# Patient Record
Sex: Female | Born: 1960 | Race: Black or African American | Hispanic: No | Marital: Married | Smoking: Never smoker
Health system: Southern US, Community
[De-identification: ages and names within clinical notes are randomized; demographics above are authoritative.]

## PROBLEM LIST (undated history)

## (undated) DIAGNOSIS — E785 Hyperlipidemia, unspecified: Secondary | ICD-10-CM

## (undated) DIAGNOSIS — I2510 Atherosclerotic heart disease of native coronary artery without angina pectoris: Secondary | ICD-10-CM

## (undated) DIAGNOSIS — I219 Acute myocardial infarction, unspecified: Secondary | ICD-10-CM

## (undated) DIAGNOSIS — I255 Ischemic cardiomyopathy: Secondary | ICD-10-CM

---

## 2006-05-13 DIAGNOSIS — I219 Acute myocardial infarction, unspecified: Secondary | ICD-10-CM

## 2006-05-13 HISTORY — DX: Acute myocardial infarction, unspecified: I21.9

## 2008-07-31 ENCOUNTER — Emergency Department: Payer: Self-pay | Admitting: Emergency Medicine

## 2012-06-09 ENCOUNTER — Emergency Department: Payer: Self-pay | Admitting: Emergency Medicine

## 2012-06-09 LAB — COMPREHENSIVE METABOLIC PANEL
Albumin: 3.8 g/dL (ref 3.4–5.0)
Alkaline Phosphatase: 56 U/L (ref 50–136)
Anion Gap: 7 (ref 7–16)
BUN: 12 mg/dL (ref 7–18)
Bilirubin,Total: 0.2 mg/dL (ref 0.2–1.0)
Calcium, Total: 8.6 mg/dL (ref 8.5–10.1)
Co2: 26 mmol/L (ref 21–32)
EGFR (Non-African Amer.): >60
Glucose: 84 mg/dL (ref 65–99)
Potassium: 4.3 mmol/L (ref 3.5–5.1)
SGOT(AST): 27 U/L (ref 15–37)
SGPT (ALT): 28 U/L (ref 12–78)
Sodium: 137 mmol/L (ref 136–145)
Total Protein: 7.2 g/dL (ref 6.4–8.2)

## 2012-06-09 LAB — URINALYSIS, COMPLETE
Bilirubin,UR: NEGATIVE
Blood: NEGATIVE
Ketone: NEGATIVE
Leukocyte Esterase: NEGATIVE
Ph: 7 (ref 4.5–8.0)
Specific Gravity: 1.003 (ref 1.003–1.030)
Squamous Epithelial: 2
WBC UR: <1 /HPF (ref 0–5)

## 2012-06-09 LAB — CBC
HGB: 12.4 g/dL (ref 12.0–16.0)
MCH: 27.1 pg (ref 26.0–34.0)
MCV: 85 fL (ref 80–100)
RBC: 4.56 10*6/uL (ref 3.80–5.20)
RDW: 13.1 % (ref 11.5–14.5)
WBC: 4.2 10*3/uL (ref 3.6–11.0)
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
## 2016-01-11 ENCOUNTER — Inpatient Hospital Stay (HOSPITAL_COMMUNITY)
Admission: EM | Disposition: A | Discharge status: Home / Self Care | Payer: Self-pay | Source: Home / Self Care | Attending: Internal Medicine

## 2016-01-11 ENCOUNTER — Inpatient Hospital Stay (HOSPITAL_COMMUNITY)
Admission: EM | Admit: 2016-01-11 | Discharge: 2016-01-13 | DRG: 246 | Disposition: A | Discharge status: Home / Self Care | Payer: 59 | Attending: Internal Medicine | Admitting: Internal Medicine

## 2016-01-11 ENCOUNTER — Encounter (HOSPITAL_COMMUNITY): Payer: Self-pay | Admitting: *Deleted

## 2016-01-11 ENCOUNTER — Emergency Department (HOSPITAL_COMMUNITY): Payer: 59

## 2016-01-11 DIAGNOSIS — Z7982 Long term (current) use of aspirin: Secondary | ICD-10-CM

## 2016-01-11 DIAGNOSIS — I25700 Atherosclerosis of coronary artery bypass graft(s), unspecified, with unstable angina pectoris: Secondary | ICD-10-CM | POA: Diagnosis not present

## 2016-01-11 DIAGNOSIS — R079 Chest pain, unspecified: Secondary | ICD-10-CM | POA: Diagnosis present

## 2016-01-11 DIAGNOSIS — I2582 Chronic total occlusion of coronary artery: Secondary | ICD-10-CM | POA: Diagnosis present

## 2016-01-11 DIAGNOSIS — E785 Hyperlipidemia, unspecified: Secondary | ICD-10-CM | POA: Diagnosis present

## 2016-01-11 DIAGNOSIS — I2129 ST elevation (STEMI) myocardial infarction involving other sites: Secondary | ICD-10-CM | POA: Diagnosis present

## 2016-01-11 DIAGNOSIS — I252 Old myocardial infarction: Secondary | ICD-10-CM

## 2016-01-11 DIAGNOSIS — I255 Ischemic cardiomyopathy: Secondary | ICD-10-CM | POA: Diagnosis present

## 2016-01-11 DIAGNOSIS — Z79899 Other long term (current) drug therapy: Secondary | ICD-10-CM | POA: Diagnosis not present

## 2016-01-11 DIAGNOSIS — I2121 ST elevation (STEMI) myocardial infarction involving left circumflex coronary artery: Secondary | ICD-10-CM

## 2016-01-11 DIAGNOSIS — I2510 Atherosclerotic heart disease of native coronary artery without angina pectoris: Secondary | ICD-10-CM | POA: Diagnosis present

## 2016-01-11 DIAGNOSIS — Z955 Presence of coronary angioplasty implant and graft: Secondary | ICD-10-CM | POA: Diagnosis not present

## 2016-01-11 DIAGNOSIS — Z8249 Family history of ischemic heart disease and other diseases of the circulatory system: Secondary | ICD-10-CM | POA: Diagnosis not present

## 2016-01-11 DIAGNOSIS — I2109 ST elevation (STEMI) myocardial infarction involving other coronary artery of anterior wall: Secondary | ICD-10-CM | POA: Diagnosis not present

## 2016-01-11 DIAGNOSIS — I213 ST elevation (STEMI) myocardial infarction of unspecified site: Secondary | ICD-10-CM

## 2016-01-11 DIAGNOSIS — I2542 Coronary artery dissection: Secondary | ICD-10-CM | POA: Diagnosis present

## 2016-01-11 HISTORY — PX: CARDIAC CATHETERIZATION: SHX172

## 2016-01-11 HISTORY — DX: Atherosclerotic heart disease of native coronary artery without angina pectoris: I25.10

## 2016-01-11 HISTORY — DX: Acute myocardial infarction, unspecified: I21.9

## 2016-01-11 HISTORY — DX: Ischemic cardiomyopathy: I25.5

## 2016-01-11 HISTORY — DX: Hyperlipidemia, unspecified: E78.5

## 2016-01-11 LAB — CBC WITH DIFFERENTIAL/PLATELET
Basophils Absolute: 0 10*3/uL (ref 0.0–0.1)
Basophils Relative: 1 %
Eosinophils Absolute: 0.1 10*3/uL (ref 0.0–0.7)
Eosinophils Relative: 1 %
HCT: 38.2 % (ref 36.0–46.0)
HEMOGLOBIN: 12.2 g/dL (ref 12.0–15.0)
LYMPHS ABS: 1.3 10*3/uL (ref 0.7–4.0)
LYMPHS PCT: 33 %
MCH: 27 pg (ref 26.0–34.0)
MCHC: 31.9 g/dL (ref 30.0–36.0)
MCV: 84.5 fL (ref 78.0–100.0)
Monocytes Absolute: 0.4 10*3/uL (ref 0.1–1.0)
Monocytes Relative: 9 %
NEUTROS ABS: 2.3 10*3/uL (ref 1.7–7.7)
NEUTROS PCT: 56 %
Platelets: 226 10*3/uL (ref 150–400)
RBC: 4.52 MIL/uL (ref 3.87–5.11)
RDW: 13.2 % (ref 11.5–15.5)
WBC: 4.1 10*3/uL (ref 4.0–10.5)

## 2016-01-11 LAB — COMPREHENSIVE METABOLIC PANEL
ALT: 19 U/L (ref 14–54)
AST: 26 U/L (ref 15–41)
Albumin: 3.6 g/dL (ref 3.5–5.0)
Alkaline Phosphatase: 47 U/L (ref 38–126)
Anion gap: 9 (ref 5–15)
BUN: 12 mg/dL (ref 6–20)
CHLORIDE: 102 mmol/L (ref 101–111)
CO2: 24 mmol/L (ref 22–32)
CREATININE: 0.83 mg/dL (ref 0.44–1.00)
Calcium: 9.2 mg/dL (ref 8.9–10.3)
GFR calc non Af Amer: >60 mL/min (ref >60)
Glucose, Bld: 150 mg/dL — ABNORMAL HIGH (ref 65–99)
POTASSIUM: 3.2 mmol/L — AB (ref 3.5–5.1)
SODIUM: 135 mmol/L (ref 135–145)
Total Bilirubin: 0.5 mg/dL (ref 0.3–1.2)
Total Protein: 6.1 g/dL — ABNORMAL LOW (ref 6.5–8.1)

## 2016-01-11 LAB — TROPONIN I
TROPONIN I: 25.83 ng/mL — AB (ref <0.03)
Troponin I: 11.33 ng/mL (ref <0.03)

## 2016-01-11 LAB — POCT I-STAT, CHEM 8
BUN: 15 mg/dL (ref 6–20)
CHLORIDE: 101 mmol/L (ref 101–111)
CREATININE: 0.6 mg/dL (ref 0.44–1.00)
Calcium, Ion: 1.12 mmol/L — ABNORMAL LOW (ref 1.15–1.40)
GLUCOSE: 153 mg/dL — AB (ref 65–99)
HCT: 36 % (ref 36.0–46.0)
Hemoglobin: 12.2 g/dL (ref 12.0–15.0)
POTASSIUM: 3.2 mmol/L — AB (ref 3.5–5.1)
Sodium: 140 mmol/L (ref 135–145)
TCO2: 25 mmol/L (ref 0–100)

## 2016-01-11 LAB — CBC
HEMATOCRIT: 41.9 % (ref 36.0–46.0)
HEMOGLOBIN: 13.4 g/dL (ref 12.0–15.0)
MCH: 27.4 pg (ref 26.0–34.0)
MCHC: 32 g/dL (ref 30.0–36.0)
MCV: 85.7 fL (ref 78.0–100.0)
Platelets: 236 10*3/uL (ref 150–400)
RBC: 4.89 MIL/uL (ref 3.87–5.11)
RDW: 13.2 % (ref 11.5–15.5)
WBC: 11.3 10*3/uL — AB (ref 4.0–10.5)

## 2016-01-11 LAB — POCT ACTIVATED CLOTTING TIME
ACTIVATED CLOTTING TIME: 191 s
ACTIVATED CLOTTING TIME: 246 s
ACTIVATED CLOTTING TIME: 301 s
Activated Clotting Time: 379 seconds

## 2016-01-11 LAB — BASIC METABOLIC PANEL
Anion gap: 8 (ref 5–15)
BUN: 10 mg/dL (ref 6–20)
CHLORIDE: 106 mmol/L (ref 101–111)
CO2: 23 mmol/L (ref 22–32)
Calcium: 9.2 mg/dL (ref 8.9–10.3)
Creatinine, Ser: 0.74 mg/dL (ref 0.44–1.00)
GFR calc Af Amer: >60 mL/min (ref >60)
GFR calc non Af Amer: >60 mL/min (ref >60)
GLUCOSE: 92 mg/dL (ref 65–99)
POTASSIUM: 4.3 mmol/L (ref 3.5–5.1)
Sodium: 137 mmol/L (ref 135–145)

## 2016-01-11 LAB — CREATININE, SERUM: Creatinine, Ser: 0.85 mg/dL (ref 0.44–1.00)

## 2016-01-11 LAB — PROTIME-INR
INR: 1.1
Prothrombin Time: 14.2 seconds (ref 11.4–15.2)

## 2016-01-11 LAB — I-STAT TROPONIN, ED: Troponin i, poc: 0.01 ng/mL (ref 0.00–0.08)

## 2016-01-11 LAB — MRSA PCR SCREENING: MRSA by PCR: NEGATIVE

## 2016-01-11 LAB — PLATELET COUNT: Platelets: 267 10*3/uL (ref 150–400)

## 2016-01-11 SURGERY — LEFT HEART CATH AND CORONARY ANGIOGRAPHY
Anesthesia: LOCAL

## 2016-01-11 MED ORDER — SODIUM CHLORIDE 0.9 % IV SOLN
INTRAVENOUS | Status: DC | PRN
  Administered 2016-01-11: 250 mL via INTRAVENOUS

## 2016-01-11 MED ORDER — METOPROLOL TARTRATE 12.5 MG HALF TABLET
12.5000 mg | ORAL_TABLET | Freq: Two times a day (BID) | ORAL | Status: DC
  Administered 2016-01-11 – 2016-01-13 (×4): 12.5 mg via ORAL
  Filled 2016-01-11 (×4): qty 1

## 2016-01-11 MED ORDER — MORPHINE SULFATE (PF) 2 MG/ML IV SOLN
2.0000 mg | INTRAVENOUS | Status: DC | PRN
  Administered 2016-01-11 – 2016-01-12 (×3): 2 mg via INTRAVENOUS
  Filled 2016-01-11 (×3): qty 1

## 2016-01-11 MED ORDER — NITROGLYCERIN 1 MG/10 ML FOR IR/CATH LAB
INTRA_ARTERIAL | Status: AC
  Filled 2016-01-11: qty 10

## 2016-01-11 MED ORDER — IOPAMIDOL (ISOVUE-370) INJECTION 76%
INTRAVENOUS | Status: AC
  Filled 2016-01-11: qty 125

## 2016-01-11 MED ORDER — NITROGLYCERIN 0.4 MG SL SUBL
0.4000 mg | SUBLINGUAL_TABLET | SUBLINGUAL | Status: DC | PRN
  Administered 2016-01-11 (×2): 0.4 mg via SUBLINGUAL
  Filled 2016-01-11: qty 1
  Filled 2016-01-11: qty 25
  Filled 2016-01-11 (×2): qty 1

## 2016-01-11 MED ORDER — ONDANSETRON HCL 4 MG/2ML IJ SOLN
4.0000 mg | Freq: Once | INTRAMUSCULAR | Status: AC
  Administered 2016-01-11: 4 mg via INTRAVENOUS
  Filled 2016-01-11: qty 2

## 2016-01-11 MED ORDER — HEPARIN BOLUS VIA INFUSION
4000.0000 [IU] | Freq: Once | INTRAVENOUS | Status: AC
  Administered 2016-01-11: 4000 [IU] via INTRAVENOUS
  Filled 2016-01-11: qty 4000

## 2016-01-11 MED ORDER — ASPIRIN 81 MG PO CHEW
81.0000 mg | CHEWABLE_TABLET | Freq: Every day | ORAL | Status: DC
  Administered 2016-01-12 – 2016-01-13 (×2): 81 mg via ORAL
  Filled 2016-01-11 (×2): qty 1

## 2016-01-11 MED ORDER — HEPARIN SODIUM (PORCINE) 5000 UNIT/ML IJ SOLN
INTRAMUSCULAR | Status: AC
  Filled 2016-01-11: qty 1

## 2016-01-11 MED ORDER — AMIODARONE LOAD VIA INFUSION
INTRAVENOUS | Status: DC | PRN
  Administered 2016-01-11: 300 mg via INTRAVENOUS

## 2016-01-11 MED ORDER — VERAPAMIL HCL 2.5 MG/ML IV SOLN
INTRAVENOUS | Status: AC
  Filled 2016-01-11: qty 2

## 2016-01-11 MED ORDER — ASPIRIN 81 MG PO CHEW: 81.0000 mg | CHEWABLE_TABLET | Freq: Every day | ORAL | Status: DC

## 2016-01-11 MED ORDER — HEART ATTACK BOUNCING BOOK
Freq: Once | Status: AC
  Administered 2016-01-11: 13:00:00
  Filled 2016-01-11: qty 1

## 2016-01-11 MED ORDER — TIROFIBAN HCL IN NACL 5-0.9 MG/100ML-% IV SOLN
INTRAVENOUS | Status: DC | PRN
  Administered 2016-01-11: 0.15 ug/kg/min via INTRAVENOUS

## 2016-01-11 MED ORDER — SODIUM CHLORIDE 0.9% FLUSH: 3.0000 mL | INTRAVENOUS | Status: DC | PRN

## 2016-01-11 MED ORDER — VERAPAMIL HCL 2.5 MG/ML IV SOLN
INTRAVENOUS | Status: DC | PRN
  Administered 2016-01-11: 10:00:00 via INTRA_ARTERIAL

## 2016-01-11 MED ORDER — ACETAMINOPHEN 325 MG PO TABS
650.0000 mg | ORAL_TABLET | ORAL | Status: DC | PRN
  Administered 2016-01-11: 650 mg via ORAL
  Filled 2016-01-11: qty 2

## 2016-01-11 MED ORDER — HEPARIN SODIUM (PORCINE) 1000 UNIT/ML IJ SOLN
INTRAMUSCULAR | Status: AC
  Filled 2016-01-11: qty 1

## 2016-01-11 MED ORDER — TIROFIBAN (AGGRASTAT) BOLUS VIA INFUSION
INTRAVENOUS | Status: DC | PRN
  Administered 2016-01-11: 1655 ug via INTRAVENOUS

## 2016-01-11 MED ORDER — SODIUM CHLORIDE 0.9 % IV SOLN
250.0000 mL | INTRAVENOUS | Status: DC | PRN
  Administered 2016-01-11: 250 mL via INTRAVENOUS

## 2016-01-11 MED ORDER — SODIUM CHLORIDE 0.9 % IV SOLN
INTRAVENOUS | Status: AC
  Administered 2016-01-11: 12:00:00 via INTRAVENOUS

## 2016-01-11 MED ORDER — TIROFIBAN HCL IN NACL 5-0.9 MG/100ML-% IV SOLN
INTRAVENOUS | Status: AC
  Filled 2016-01-11: qty 100

## 2016-01-11 MED ORDER — ZOLPIDEM TARTRATE 5 MG PO TABS
5.0000 mg | ORAL_TABLET | Freq: Every evening | ORAL | Status: DC | PRN
  Administered 2016-01-11 – 2016-01-12 (×2): 5 mg via ORAL
  Filled 2016-01-11 (×2): qty 1

## 2016-01-11 MED ORDER — TICAGRELOR 90 MG PO TABS
ORAL_TABLET | ORAL | Status: DC | PRN
  Administered 2016-01-11: 180 mg via ORAL

## 2016-01-11 MED ORDER — ONDANSETRON HCL 4 MG/2ML IJ SOLN: 4.0000 mg | Freq: Four times a day (QID) | INTRAMUSCULAR | Status: DC | PRN

## 2016-01-11 MED ORDER — NITROGLYCERIN IN D5W 200-5 MCG/ML-% IV SOLN
0.0000 ug/min | INTRAVENOUS | Status: DC
  Administered 2016-01-11: 5 ug/min via INTRAVENOUS
  Filled 2016-01-11: qty 250

## 2016-01-11 MED ORDER — ENOXAPARIN SODIUM 40 MG/0.4ML ~~LOC~~ SOLN
40.0000 mg | SUBCUTANEOUS | Status: DC
  Administered 2016-01-12 – 2016-01-13 (×2): 40 mg via SUBCUTANEOUS
  Filled 2016-01-11 (×2): qty 0.4

## 2016-01-11 MED ORDER — ATORVASTATIN CALCIUM 80 MG PO TABS
80.0000 mg | ORAL_TABLET | Freq: Every day | ORAL | Status: DC
  Administered 2016-01-11 – 2016-01-12 (×2): 80 mg via ORAL
  Filled 2016-01-11 (×2): qty 1

## 2016-01-11 MED ORDER — IOPAMIDOL (ISOVUE-370) INJECTION 76%
INTRAVENOUS | Status: DC | PRN
  Administered 2016-01-11: 255 mL via INTRA_ARTERIAL

## 2016-01-11 MED ORDER — TICAGRELOR 90 MG PO TABS
ORAL_TABLET | ORAL | Status: AC
  Filled 2016-01-11: qty 2

## 2016-01-11 MED ORDER — IOPAMIDOL (ISOVUE-370) INJECTION 76%
INTRAVENOUS | Status: AC
  Filled 2016-01-11: qty 100

## 2016-01-11 MED ORDER — FENTANYL CITRATE (PF) 100 MCG/2ML IJ SOLN
100.0000 ug | Freq: Once | INTRAMUSCULAR | Status: AC
Start: 2016-01-11 — End: 2016-01-11
  Administered 2016-01-11: 100 ug via INTRAVENOUS
  Filled 2016-01-11: qty 2

## 2016-01-11 MED ORDER — HEPARIN SODIUM (PORCINE) 5000 UNIT/ML IJ SOLN: 60.0000 [IU]/kg | Freq: Once | INTRAMUSCULAR | Status: DC

## 2016-01-11 MED ORDER — TICAGRELOR 90 MG PO TABS
90.0000 mg | ORAL_TABLET | Freq: Two times a day (BID) | ORAL | Status: DC
  Administered 2016-01-11 – 2016-01-13 (×4): 90 mg via ORAL
  Filled 2016-01-11 (×4): qty 1

## 2016-01-11 MED ORDER — FENTANYL CITRATE (PF) 100 MCG/2ML IJ SOLN
INTRAMUSCULAR | Status: DC | PRN
  Administered 2016-01-11: 25 ug via INTRAVENOUS

## 2016-01-11 MED ORDER — SODIUM CHLORIDE 0.9% FLUSH
3.0000 mL | Freq: Two times a day (BID) | INTRAVENOUS | Status: DC
  Administered 2016-01-11 – 2016-01-13 (×4): 3 mL via INTRAVENOUS

## 2016-01-11 MED ORDER — LIDOCAINE HCL (PF) 1 % IJ SOLN
INTRAMUSCULAR | Status: DC | PRN
  Administered 2016-01-11: 12:00:00

## 2016-01-11 MED ORDER — HEPARIN (PORCINE) IN NACL 2-0.9 UNIT/ML-% IJ SOLN
INTRAMUSCULAR | Status: AC
  Filled 2016-01-11: qty 1000

## 2016-01-11 MED ORDER — FENTANYL CITRATE (PF) 100 MCG/2ML IJ SOLN
INTRAMUSCULAR | Status: AC
  Filled 2016-01-11: qty 2

## 2016-01-11 MED ORDER — LIDOCAINE HCL (PF) 1 % IJ SOLN
INTRAMUSCULAR | Status: AC
  Filled 2016-01-11: qty 30

## 2016-01-11 MED ORDER — NITROGLYCERIN 0.4 MG SL SUBL
SUBLINGUAL_TABLET | SUBLINGUAL | Status: AC
  Filled 2016-01-11: qty 1

## 2016-01-11 MED ORDER — HEPARIN SODIUM (PORCINE) 1000 UNIT/ML IJ SOLN
INTRAMUSCULAR | Status: DC | PRN
  Administered 2016-01-11: 3000 [IU] via INTRAVENOUS
  Administered 2016-01-11 (×2): 2000 [IU] via INTRAVENOUS

## 2016-01-11 MED ORDER — ATROPINE SULFATE 1 MG/10ML IJ SOSY
PREFILLED_SYRINGE | INTRAMUSCULAR | Status: AC
  Filled 2016-01-11: qty 10

## 2016-01-11 MED ORDER — AMIODARONE HCL 150 MG/3ML IV SOLN
INTRAVENOUS | Status: AC
  Filled 2016-01-11: qty 6

## 2016-01-11 MED ORDER — POTASSIUM CHLORIDE ER 10 MEQ PO TBCR
40.0000 meq | EXTENDED_RELEASE_TABLET | Freq: Once | ORAL | Status: AC
  Administered 2016-01-11: 40 meq via ORAL
  Filled 2016-01-11 (×2): qty 4

## 2016-01-11 MED ORDER — IOPAMIDOL (ISOVUE-370) INJECTION 76%
INTRAVENOUS | Status: AC
  Filled 2016-01-11: qty 50

## 2016-01-11 SURGICAL SUPPLY — 23 items
BALLN EMERGE MR 2.0X12 (BALLOONS) ×2
BALLN EMERGE MR 2.5X15 (BALLOONS) ×2
BALLN ~~LOC~~ EMERGE MR 2.25X12 (BALLOONS) ×2
BALLN ~~LOC~~ TREK RX 3.0X15 (BALLOONS) ×2
BALLOON EMERGE MR 2.0X12 (BALLOONS) ×1 IMPLANT
BALLOON EMERGE MR 2.5X15 (BALLOONS) ×1 IMPLANT
BALLOON ~~LOC~~ EMERGE MR 2.25X12 (BALLOONS) ×1 IMPLANT
BALLOON ~~LOC~~ TREK RX 3.0X15 (BALLOONS) ×1 IMPLANT
CATH IMPULSE 5F ANG/FL3.5 (CATHETERS) ×2 IMPLANT
CATH VISTA GUIDE 6FR XB3 (CATHETERS) ×2 IMPLANT
DEVICE RAD COMP TR BAND LRG (VASCULAR PRODUCTS) ×2 IMPLANT
GLIDESHEATH SLEND SS 6F .021 (SHEATH) ×2 IMPLANT
KIT ENCORE 26 ADVANTAGE (KITS) ×4 IMPLANT
KIT HEART LEFT (KITS) ×2 IMPLANT
PACK CARDIAC CATHETERIZATION (CUSTOM PROCEDURE TRAY) ×2 IMPLANT
STENT PROMUS PREM MR 2.25X12 (Permanent Stent) ×2 IMPLANT
STENT PROMUS PREM MR 2.25X20 (Permanent Stent) ×2 IMPLANT
STENT PROMUS PREM MR 3.0X32 (Permanent Stent) ×2 IMPLANT
TRANSDUCER W/STOPCOCK (MISCELLANEOUS) ×2 IMPLANT
TUBING CIL FLEX 10 FLL-RA (TUBING) ×2 IMPLANT
WIRE ASAHI PROWATER 180CM (WIRE) ×2 IMPLANT
WIRE RUNTHROUGH .014X180CM (WIRE) ×2 IMPLANT
WIRE SAFE-T 1.5MM-J .035X260CM (WIRE) ×2 IMPLANT

## 2016-01-11 NOTE — ED Notes (Signed)
Patient undressed, in gown, on monitor, continuous pulse, blood pressure cuff and oxygen [place] (2L); Myself, Shanda BumpsJessica, RN, Alphonzo LemmingsWhitney, RN, Nehemiah SettleBrooke, RN and [place], RN all present in room with patient

## 2016-01-11 NOTE — H&P (Signed)
History & Physical    Patient ID: Tamara [name] MRN: [id number], DOB/AGE: 53-Apr-1962   Admit date: 01/11/2016   Primary Physician: No primary care provider on file. Primary Cardiologist: New  Patient Profile    55 yo female with PMH of HLD and CAD s/p stent to LAD in 2008 who [facility] with central chest pain and back pain while at work.   Past Medical History    Past Medical History:  Diagnosis Date  . Hyperlipemia   . Myocardial infarct (HCC) 05/13/2006   a. PCI with DES to LAD (2008)    No past surgical history on file.   Allergies  Allergies  Allergen Reactions  . Penicillins Rash    History of Present Illness    Mrs. Tamara [name] is a 55 yo female with PMH of HLD and CAD s/p stenting of LAD back in [facility]. In review of her records she underwent myocardial perfusion testing in 3/16 showing no evidence of ischemia or scarring.  Home medications included ASA 81mg , and high dose statin. Reported an intolerance to metoprolol previously, but reported willing to resume if indicated. Her EF at that time was noted at >65%.    Reports she was sitting at work today at her desk around 8am when she developed a sudden onset of centralized chest pain with radiation into her back. These symptoms are similar to those she experienced with her last stent. She was [facility] via EMS and given 324 ASA with 3 SL nitro with no relief. EKG on arrival to the ED showed ST depression in v2,v3,v4 with reciprocal changes in the v6.   In the ED she continued to report ongoing chest pain and was given 100mcg of fentanyl, along with a 4000 unit bolus of heparin. She was pale and diaphoretic. A code STEMI was activated in the ED and she was [facility] emerg[facility] with [facility].   Home Medications    Prior to Admission medications   Not on File    Family History    Father with history of CAD and polycystic kidney  disease. Brother with polycystic kidney disease. Mother with diabetes. Sister with aneurysm.  Social History    Social History   Social History  . Marital status: Married    Spouse name: N/A  . Number of children: N/A  . Years of education: N/A   Occupational History  . Not on file.   Social History Main Topics  . Smoking status: Never Smoker  . Smokeless tobacco: Never Used  . Alcohol use Not on file  . Drug use: Unknown  . Sexual activity: Not on file   Other Topics Concern  . Not on file   Social History Narrative  . No narrative on file     Review of Systems    General:  No chills, fever, night sweats or weight changes.  Cardiovascular:  See HPI Dermatological: No rash, lesions/masses Respiratory: No cough, dyspnea Urologic: No hematuria, dysuria Abdominal:   No nausea, vomiting, diarrhea, bright red blood per rectum, melena, or hematemesis Neurologic:  No visual changes, wkns, changes in mental status. All other systems reviewed and are otherwise negative except as noted above.  Physical Exam    Blood pressure 137/82, pulse 65, temperature (!) 96.7 F (35.9 C), temperature source Axillary, resp. rate 21, height 5\' 4"  (1.626 m), weight 146 lb (66.2 kg), SpO2 99 %.  General:  Pale, grimacing, diaphoretic  Psych: Normal affect. Neuro: Alert and oriented X 3. Moves all extremities spontaneously. HEENT: Normal  Neck: Supple without bruits or JVD. Lungs:  Resp regular and unlabored, CTA. Heart: RRR no s3, s4, or murmurs. Abdomen: Soft, non-tender, BS + x 4.  Extremities: No clubbing, cyanosis or edema. DP/PT/Radials 2+ and equal bilaterally.  Labs    Troponin [facility] Test)  Recent Labs  01/11/16 0915  TROPIPOC 0.01   No results for input(s): CKTOTAL, CKMB, TROPONINI in the last 72 hours. Lab Results  Component Value Date   WBC 4.1 01/11/2016   HGB 12.2 01/11/2016   HCT 38.2 01/11/2016   MCV 84.5 01/11/2016   PLT 226 01/11/2016     Recent  Labs Lab 01/11/16 0909  NA 135  K 3.2*  CL 102  CO2 24  BUN 12  CREATININE 0.83  CALCIUM 9.2  PROT 6.1*  BILITOT 0.5  ALKPHOS 47  ALT 19  AST 26  GLUCOSE 150*   No results found for: CHOL, HDL, LDLCALC, TRIG No results found for: [facility]   Radiology Studies    Dg Chest Port 1 View  Result Date: 01/11/2016 CLINICAL DATA:  Chest pain for 1 day EXAM: PORTABLE CHEST 1 VIEW COMPARISON:  None. FINDINGS: Lungs are clear. Heart size and pulmonary vascularity are normal. No adenopathy. No pneumothorax. No bone lesions. IMPRESSION: No edema or consolidation. Electronically Signed   By: Bretta Bang III M.D.   On: 01/11/2016 09:25    ECG & Cardiac Imaging    PET perfusion study: 07/22/2014  Impressions: - Normal myocardial perfusion study. - No evidence for significant ischemia or scar is noted. - During stress: Global systolic function is normal. The ejection fraction  was greater than 65%.  Assessment & Plan    1. STEMI: She was taken to cath emergently for [facility] and possible PCI with Dr. Okey Dupre. Further recommendations following procedure.  Janice Coffin, NP-C Pager [phone] 01/11/2016, 11:06 AM Patient seen and examined and history reviewed. Agree with above findings and plan. 55 yo BF with history of CAD s/p remote stenting of the LAD in 2008. Last PET perfusion scan in March 2016 was normal. She has treated hypercholesterolemia. Compliant with meds. Today developed severe crushing SSCP while at work. Ecg shows ST depression in leads V1-3 with ST elevation in lead V6 consistent with acute posterior STEMI.  CODE STEMI activated. Patient given ASA and IV heparin. Will proceed with emergent cardiac cath/PCI.   Kiaira Pointer [place], MDFACC 01/11/2016 11:06 AM

## 2016-01-11 NOTE — Progress Notes (Signed)
Cardiology Event Note  Subjective:  I was alerted by PA Janee Mornhompson that the patient has continued to have mild to moderate chest pain following PCI to LCx/OM this morning.  The pt currently rates her pain as 5/10 in intensity and feels similar to but much less severe than what brought her to the ED this morning.  She denies shortness of breath and nausea.  She received SL NTG and IV morphine with some improvement.  Objective:   Temp:  [96.7 F (35.9 C)-97.5 F (36.4 C)] 97.5 F (36.4 C) (08/31 1518) Pulse Rate:  [0-155] 56 (08/31 1600) Resp:  [0-25] 10 (08/31 1600) BP: (100-149)/(58-87) 123/81 (08/31 1600) SpO2:  [0 %-100 %] 100 % (08/31 1600) Weight:  [66.2 kg (146 lb)-66.6 kg (146 lb 13.2 oz)] 66.6 kg (146 lb 13.2 oz) (08/31 1200) Gen: Well-developed woman lying comfortably in bed. Lungs: Clear anteriorly, normal work of breathing. CV: RRR without m/r/g Abd: Soft, NT/ND. Ext: Right radial arteriotomy site clean with TR band in place.  EKG: NSR and low voltage.  Pre-PCI anterior ST depressions and ST elevation in V6 have resolved.  Limited bedside echo: Inferolateral hypokinesis with otherwise preserved LV function.  LVEF mildly reduced.  No pericardial effusion.  Assessment/Plan: 55 y/o woman s/p posterior STEMI with occlusion of large OM1 branch and SCAD versus ulcerated plaque involving the proximal LCx.  He chest pain has improved significantly since PCI, and now has improved further with NTG and morphine.  Her EKG is reassuring.  Echo shows hypokinesis in the LCx/OM territory but no evidence of pericardial effusion.  I suspect continued chest pain may be due to microvascular embolization during procedure.  We will titrate NTG infusion to relief of pain, while maintaining SBP > 100 mmHg.  Further uptitration of metoprolol is limited by her blood pressure.  Will continue to monitor in 2H.  Formal echocardiogram is pending.  Yvonne Kendallhristopher Chanoch Mccleery, MD [facility] Pager: [phone]

## 2016-01-11 NOTE — ED Triage Notes (Signed)
[facility] with new onset substernal chest sharp, pressure type pain rating a 10 out of a 10 pain. Pain radiates to upper back and left arm. Patient was sitting at a desk when the pain started. Hx of MI and stent placement 10 years ago. C/o of dizziness with pain. Patient took 325 mg Asprin and nitro x3 with no relief. IV access 20 LAC. EMS EKG showed ST depression in the V3 and V4. V/S stable.

## 2016-01-11 NOTE — Progress Notes (Signed)
    Called for ongoing chest pain since cath. No worsening and not as bad as when she presented. ECG with resolution of ST depression in V1-V3 and STE in V6. Discussed with Dr. Okey DupreEnd who will go see. Will give her some SL NTG and morphine as needed.   Cline CrockKathryn Yentl Verge PA-C  [facility]

## 2016-01-11 NOTE — Care Management Note (Signed)
Case Management Note  Patient Details  Name: Tamara [name] MRN: [id number] Date of Birth: [birth year]  Subjective/Objective:     Adm w stemi               Action/Plan: lives w fam   Expected Discharge Date:                  Expected Discharge Plan:  Home/Self Care  In-House Referral:     Discharge planning Services  CM Consult, Medication Assistance  Post Acute Care Choice:    Choice offered to:     DME Arranged:    DME Agency:     HH Arranged:    HH Agency:     Status of Service:     If discussed at [employer] of [employer] Meetings, dates discussed:    Additional Comments: gave pt 30day free and copay card for brilinta. Pt states has ins.   Hanley Haysowell, Jarold Macomber T, RN 01/11/2016, 2:03 PM

## 2016-01-11 NOTE — ED Provider Notes (Signed)
[facility]-EMERGENCY DEPT Provider Note   CSN: [id number] Arrival date & time: 01/11/16  4098     History   Chief Complaint Chief Complaint  Patient presents with  . Chest Pain    HPI Tamara [name] is a 55 y.o. female.  Tamara [name] is a 55 y.o. Female who presents to the emergency department via [facility] complaining of sudden onset of substernal sharp chest pain that radiates to the back of her shoulder blades. She reports her pain is a 10 out of 10 and can radiate to her left arm. She reports some numbness to her left arm.  Patient reports history of MI previously and has had stenting done in 2008 in [place]. She is followed by cardiologist in [facility]. Patient had their 25 mg of aspirin and 3 rounds of nitroglycerin by EMS. No relief with nitroglycerin. Patient denies fevers, coughing, shortness of breath, abdominal pain, nausea, vomiting or rashes.   The history is provided by the patient and medical records. No language interpreter was used.  Chest Pain   Associated symptoms include numbness. Pertinent negatives include no abdominal pain, no back pain, no cough, no fever, no headaches, no nausea, no palpitations, no shortness of breath, no vomiting and no weakness.    Past Medical History:  Diagnosis Date  . Hyperlipemia   . Myocardial infarct (HCC) 05/13/2006    There are no active problems to display for this patient.   No past surgical history on file.  OB History    No data available       Home Medications    Prior to Admission medications   Not on File    Family History No family history on file.  Social History Social History  Substance Use Topics  . Smoking status: Never Smoker  . Smokeless tobacco: Never Used  . Alcohol use Not on file     Allergies   Penicillins   Review of Systems Review of Systems  Constitutional: Negative for chills and fever.  HENT: Negative for congestion and sore throat.   Eyes: Negative for  visual disturbance.  Respiratory: Negative for cough and shortness of breath.   Cardiovascular: Positive for chest pain. Negative for palpitations and leg swelling.  Gastrointestinal: Negative for abdominal pain, diarrhea, nausea and vomiting.  Genitourinary: Negative for dysuria.  Musculoskeletal: Negative for back pain and neck pain.  Skin: Negative for rash.  Neurological: Positive for numbness. Negative for weakness and headaches.     Physical Exam Updated Vital Signs BP 137/82   Pulse 65   Temp (!) 96.7 F (35.9 C) (Axillary)   Resp 21   Ht 5\' 4"  (1.626 m)   Wt 66.2 kg   SpO2 99%   BMI 25.06 kg/m   Physical Exam  Constitutional: She is oriented to person, place, and time. She appears well-developed and well-nourished.  Appears uncomfortable. Pale.   HENT:  Head: Normocephalic and atraumatic.  Mouth/Throat: Oropharynx is clear and moist.  Eyes: Conjunctivae are normal. Pupils are equal, round, and reactive to light. Right eye exhibits no discharge. Left eye exhibits no discharge.  Neck: Normal range of motion. Neck supple. No JVD present.  Cardiovascular: Normal rate, regular rhythm, normal heart sounds and intact distal pulses.  Exam reveals no gallop and no friction rub.   No murmur heard. Bilateral radial, posterior tibialis and dorsalis pedis pulses are intact.    Pulmonary/Chest: Effort normal and breath sounds normal. No stridor. No respiratory distress. She has  no wheezes. She has no rales. She exhibits tenderness.  Lungs clear auscultation bilaterally. Chest wall is tender to palpation.  Abdominal: Soft. There is no tenderness. There is no guarding.  Musculoskeletal: She exhibits no edema or tenderness.  No lower extremity edema or tenderness.  Lymphadenopathy:    She has no cervical adenopathy.  Neurological: She is alert and oriented to person, place, and time. Coordination normal.  Skin: Skin is warm and dry. Capillary refill takes less than 2 seconds. No  rash noted. She is not diaphoretic. No erythema. There is pallor.  Pale.   Psychiatric: She has a normal mood and affect. Her behavior is normal.  Nursing note and vitals reviewed.    ED Treatments / Results  Labs (all labs ordered are listed, but only abnormal results are displayed) Labs Reviewed  CBC WITH DIFFERENTIAL/PLATELET  PROTIME-INR  COMPREHENSIVE METABOLIC PANEL  I-STAT TROPOININ, ED    EKG  EKG Interpretation  Date/Time:  Thursday January 11 2016 08:56:57 EDT Ventricular Rate:  71 PR Interval:    QRS Duration: 77 QT Interval:  382 QTC Calculation: 416 R Axis:   37 Text Interpretation:  Ventricular-paced complexes No further rhythm analysis attempted due to paced rhythm Posterior infarct, acute (LCx) No old tracing to compare Confirmed by CAMPOS  MD, Caryn BeeKEVIN ([id number]) on 01/11/2016 9:01:51 AM       Radiology Dg Chest Port 1 View  Result Date: 01/11/2016 CLINICAL DATA:  Chest pain for 1 day EXAM: PORTABLE CHEST 1 VIEW COMPARISON:  None. FINDINGS: Lungs are clear. Heart size and pulmonary vascularity are normal. No adenopathy. No pneumothorax. No bone lesions. IMPRESSION: No edema or consolidation. Electronically Signed   By: Bretta BangWilliam  Woodruff III M.D.   On: 01/11/2016 09:25    Procedures Procedures (including critical care time)  Medications Ordered in ED Medications  heparin 5000 UNIT/ML injection (not administered)  fentaNYL (SUBLIMAZE) injection 100 mcg (100 mcg Intravenous Given 01/11/16 0913)  ondansetron (ZOFRAN) injection 4 mg (4 mg Intravenous Given 01/11/16 0913)  heparin bolus via infusion 4,000 Units (4,000 Units Intravenous Bolus from Bag 01/11/16 0926)     Initial Impression / Assessment and Plan / ED Course  I have reviewed the triage vital signs and the nursing notes.  Pertinent labs & imaging results that were available during my care of the patient were reviewed by me and considered in my medical decision making (see chart for  details).  Clinical Course   Patient presents to the emergency department by EMS complaining of sudden onset of chest pain prior to arrival. Chest pain is substernal and radiates to her back between her shoulder blades. She is a history of MI previously with stenting in 2008. On exam the patient appears pale and uncomfortable. Patient received aspirin and nitroglycerin by EMS. No relief with nitroglycerin. EKG on her arrival is concerning for possible posterior infarct. I consulted cardiology who came down to see the patient immediately. Code STEMI was activated in Cath Lab activated. Patient started on heparin bolus. Dr. Swazil[place] and Dr. Okey DupreEnd down to see patient and patient taken to cath lab. Dr. Patria Maneampos also at bedside.   This patient was discussed with and evaluated by Dr. Patria Maneampos who agrees with assessment.   Final Clinical Impressions(s) / ED Diagnoses   Final diagnoses:  ST elevation myocardial infarction (STEMI), unspecified artery [facility](HCC)    New Prescriptions New Prescriptions   No medications on file    CRITICAL CARE Performed by: Lawana Chambersansie,Jaley Yan Duncan   Total critical care  time: 35 minutes  Critical care time was exclusive of separately billable procedures and treating other patients.  Critical care was necessary to treat or prevent imminent or life-threatening deterioration.  Critical care was time spent personally by me on the following activities: development of treatment plan with patient and/or surrogate as well as nursing, discussions with consultants, evaluation of patient's response to treatment, examination of patient, obtaining history from patient or surrogate, ordering and performing treatments and interventions, ordering and review of laboratory studies, ordering and review of radiographic studies, pulse oximetry and re-evaluation of patient's condition.    Everlene Farrier, PA-C 01/11/16 1610    Azalia Bilis, MD 01/11/16 2224

## 2016-01-11 NOTE — Progress Notes (Addendum)
Patient C/O 5/10 CP radiating to back; unrelieved by tylenol and comfort measures; VSS. PA notified and EKG obtained.  PRN orders received for Morphine & Nitro.  Notified by lab that troponin elevated.  MD aware.  Will continue to monitor. [place], Mitzi HansenJessica Marie

## 2016-01-11 NOTE — Progress Notes (Signed)
Right radial TR band removed, gauze dressing applied.  Bilateral radial pulses +2 and right radial site level 0.  Patient instructed on arm movement restrictions. [place], Tamara HansenJessica Marie

## 2016-01-11 NOTE — ED Notes (Signed)
Cardiology at bedside. Code STEMI will be activated. Pt placed on zoll pads.

## 2016-01-12 ENCOUNTER — Encounter (HOSPITAL_COMMUNITY): Payer: Self-pay

## 2016-01-12 ENCOUNTER — Inpatient Hospital Stay (HOSPITAL_COMMUNITY): Payer: 59

## 2016-01-12 DIAGNOSIS — I2109 ST elevation (STEMI) myocardial infarction involving other coronary artery of anterior wall: Secondary | ICD-10-CM

## 2016-01-12 LAB — ECHOCARDIOGRAM COMPLETE
Height: 64 in
Weight: 2349.22 oz

## 2016-01-12 LAB — CBC
HCT: 39.2 % (ref 36.0–46.0)
Hemoglobin: 12.3 g/dL (ref 12.0–15.0)
MCH: 26.6 pg (ref 26.0–34.0)
MCHC: 31.4 g/dL (ref 30.0–36.0)
MCV: 84.8 fL (ref 78.0–100.0)
Platelets: 239 10*3/uL (ref 150–400)
RBC: 4.62 MIL/uL (ref 3.87–5.11)
RDW: 13.3 % (ref 11.5–15.5)
WBC: 6.1 10*3/uL (ref 4.0–10.5)

## 2016-01-12 LAB — BASIC METABOLIC PANEL
ANION GAP: 10 (ref 5–15)
BUN: 9 mg/dL (ref 6–20)
CALCIUM: 9.1 mg/dL (ref 8.9–10.3)
CO2: 23 mmol/L (ref 22–32)
Chloride: 105 mmol/L (ref 101–111)
Creatinine, Ser: 0.73 mg/dL (ref 0.44–1.00)
GFR calc Af Amer: >60 mL/min (ref >60)
GLUCOSE: 93 mg/dL (ref 65–99)
Potassium: 3.9 mmol/L (ref 3.5–5.1)
SODIUM: 138 mmol/L (ref 135–145)

## 2016-01-12 LAB — LIPID PANEL
CHOLESTEROL: 121 mg/dL (ref 0–200)
HDL: 57 mg/dL (ref >40)
LDL Cholesterol: 54 mg/dL (ref 0–99)
TRIGLYCERIDES: 49 mg/dL (ref <150)
Total CHOL/HDL Ratio: 2.1 RATIO
VLDL: 10 mg/dL (ref 0–40)

## 2016-01-12 LAB — TROPONIN I: TROPONIN I: 19.55 ng/mL — AB (ref <0.03)

## 2016-01-12 MED FILL — Nitroglycerin IV Soln 100 MCG/ML in D5W: INTRA_ARTERIAL | Qty: 10 | Status: AC

## 2016-01-12 MED FILL — Amiodarone HCl Inj 150 MG/3ML (50 MG/ML): INTRAVENOUS | Qty: 3 | Status: AC

## 2016-01-12 MED FILL — Atropine Sulfate Soln Prefill Syr 1 MG/10ML (0.1 MG/ML): INTRAMUSCULAR | Qty: 10 | Status: AC

## 2016-01-12 NOTE — Progress Notes (Signed)
*  PRELIMINARY RESULTS* Echocardiogram 2D Echocardiogram has been performed.  Jeryl Columbialliott, Jonte Wollam 01/12/2016, 9:32 AM

## 2016-01-12 NOTE — Progress Notes (Signed)
Per Theatre stage managernsurance check for [employer]ilinta S/W SHANISE @ [employer] [facility] # [phone]   BRILINTA 90 MG BID ( 30 ) 60 TAB   COVER- YES  CO-PAY- $ 50.00  TIER- 3 DRUG  PRIOR APPROVAL - NO  PHARMACY : [facility] , [facility] ,[facility]   MAIL-ORDER FOR 90 DAY SUPPLY $ 100.00

## 2016-01-12 NOTE — Progress Notes (Signed)
TELEMETRY: Reviewed telemetry pt in NSR: Vitals:   01/12/16 0430 01/12/16 0500 01/12/16 0530 01/12/16 0600  BP: 106/62 101/69 110/70 115/80  Pulse: 68 69 65 62  Resp: 12 14 14 15   Temp:      TempSrc:      SpO2: 100% 100% 100% 99%  Weight:      Height:        Intake/Output Summary (Last 24 hours) at 01/12/16 0737 Last data filed at 01/12/16 0600  Gross per 24 hour  Intake           747.08 ml  Output             2400 ml  Net         -1652.92 ml   Filed Weights   01/11/16 0910 01/11/16 1200  Weight: 146 lb (66.2 kg) 146 lb 13.2 oz (66.6 kg)    Subjective  Feels very well this am. Chest pain resolved. IV Ntg off. No dyspnea.   Marland Kitchen aspirin  81 mg Oral Daily  . atorvastatin  80 mg Oral q1800  . enoxaparin (LOVENOX) injection  40 mg Subcutaneous Q24H  . metoprolol tartrate  12.5 mg Oral BID  . sodium chloride flush  3 mL Intravenous Q12H  . ticagrelor  90 mg Oral BID   . nitroGLYCERIN Stopped (01/12/16 0455)    LABS: Basic Metabolic Panel:  Recent Labs  16/10/96 1811 01/12/16 0006  NA 137 138  K 4.3 3.9  CL 106 105  CO2 23 23  GLUCOSE 92 93  BUN 10 9  CREATININE 0.74 0.73  CALCIUM 9.2 9.1   Liver Function Tests:  Recent Labs  01/11/16 0909  AST 26  ALT 19  ALKPHOS 47  BILITOT 0.5  PROT 6.1*  ALBUMIN 3.6   No results for input(s): LIPASE, AMYLASE in the last 72 hours. CBC:  Recent Labs  01/11/16 0908  01/11/16 1228 01/11/16 1811 01/12/16 0006  WBC 4.1  --  11.3*  --  6.1  NEUTROABS 2.3  --   --   --   --   HGB 12.2  < > 13.4  --  12.3  HCT 38.2  < > 41.9  --  39.2  MCV 84.5  --  85.7  --  84.8  PLT 226  --  236 267 239  < > = values in this interval not displayed. Cardiac Enzymes:  Recent Labs  01/11/16 1228 01/11/16 1811 01/12/16 0006  TROPONINI 11.33* 25.83* 19.55*   BNP: No results for input(s): PROBNP in the last 72 hours. D-Dimer: No results for input(s): DDIMER in the last 72 hours. Hemoglobin A1C: No results for  input(s): HGBA1C in the last 72 hours. Fasting Lipid Panel:  Recent Labs  01/12/16 0006  CHOL 121  HDL 57  LDLCALC 54  TRIG 49  CHOLHDL 2.1   Thyroid Function Tests: No results for input(s): TSH, T4TOTAL, T3FREE, THYROIDAB in the last 72 hours.  Invalid input(s): FREET3   Radiology/Studies:  Dg Chest Port 1 View  Result Date: 01/11/2016 CLINICAL DATA:  Chest pain for 1 day EXAM: PORTABLE CHEST 1 VIEW COMPARISON:  None. FINDINGS: Lungs are clear. Heart size and pulmonary vascularity are normal. No adenopathy. No pneumothorax. No bone lesions. IMPRESSION: No edema or consolidation. Electronically Signed   By: Bretta Bang III M.D.   On: 01/11/2016 09:25   Ecg today shows NSR with nonspecific TWA. I have personally reviewed and interpreted this study.  PHYSICAL EXAM General:  Well developed, thin, in no acute distress. Head: Normocephalic, atraumatic, sclera non-icteric, oropharynx is clear Neck: Negative for carotid bruits. JVD not elevated. No adenopathy Lungs: Clear bilaterally to auscultation without wheezes, rales, or rhonchi. Breathing is unlabored. Heart: RRR S1 S2 without murmurs, rubs, or gallops.  Abdomen: Soft, non-tender, non-distended with normoactive bowel sounds. No hepatomegaly. No rebound/guarding. No obvious abdominal masses. Msk:  Strength and tone appears normal for age. Extremities: No clubbing, cyanosis or edema.  Distal pedal pulses are 2+ and equal bilaterally. Radial site without hematoma. Neuro: Alert and oriented X 3. Moves all extremities spontaneously. Psych:  Responds to questions appropriately with a normal affect.  ASSESSMENT AND PLAN: 1. Acute posterior STEMI secondary to complex LCx occlusion. S/p emergent complex stenting of the LCX and large OM1 for bifurcation disease. No significant stenosis in LAD or RCA. Chest pain resolved. Peak troponin 25.83. Ecg improved. Plan to continue ASA, Brilinta, high dose statin and metoprolol. Advance  activity today. Check Echo.  2. Hyperlipidemia on high dose statin. LDL 54.  3. Remote stent of mid LAD  Patient will follow up with Dr. Okey DupreEnd in [place].   Present on Admission: . Acute ST elevation myocardial infarction (STEMI) of posterior wall (HCC) . Hyperlipidemia . STEMI (ST elevation myocardial infarction) (HCC)   Signed, Jonah Gingras [place], MDFACC 01/12/2016 7:37 AM

## 2016-01-12 NOTE — Progress Notes (Signed)
CARDIAC REHAB PHASE I   PRE:  Rate/Rhythm: 59 SB  BP:  Supine: 94/64  Sitting:   Standing:    SaO2:   MODE:  Ambulation: 550 ft   POST:  Rate/Rhythm: 88 SR  BP:  Supine: 102/58  Sitting:   Standing:    SaO2: 100%RA 1110-1152 Pt walked 550 ft with steady gait and no CP. Tolerated well. MI education completed with pt who voiced understanding. Stressed importance of brilinta with stent. Reviewed NTG use, ex ed, MI restrictions, heart healthy diet, risk factors. Discussed CRP 2 which pt has attended before. [facility]. Has brilinta card.   Luetta Nuttingharlene Taygan Connell, RN BSN  01/12/2016 11:49 AM

## 2016-01-12 NOTE — Progress Notes (Signed)
[facility] Telemetry box applied and CCMD notified. Vitals stable. Pt has no complaints at this time. Pt oriented to room and is resting in the bed with call light within reach. Will continue plan of care.  Berdine DanceLauren Moffitt BSN, RN

## 2016-01-12 NOTE — Progress Notes (Signed)
Patient complained of chest pain that was a 4-5, EKG done, Morphine given. Pain decreased, but still persistent. Ordered Nitro SL.

## 2016-01-13 ENCOUNTER — Encounter (HOSPITAL_COMMUNITY): Payer: Self-pay | Admitting: Family

## 2016-01-13 DIAGNOSIS — I25700 Atherosclerosis of coronary artery bypass graft(s), unspecified, with unstable angina pectoris: Secondary | ICD-10-CM

## 2016-01-13 DIAGNOSIS — I255 Ischemic cardiomyopathy: Secondary | ICD-10-CM

## 2016-01-13 DIAGNOSIS — I2510 Atherosclerotic heart disease of native coronary artery without angina pectoris: Secondary | ICD-10-CM

## 2016-01-13 MED ORDER — METOPROLOL TARTRATE 25 MG PO TABS: 12.5000 mg | ORAL_TABLET | Freq: Two times a day (BID) | ORAL | 6 refills | Status: AC

## 2016-01-13 MED ORDER — TICAGRELOR 90 MG PO TABS: 90.0000 mg | ORAL_TABLET | Freq: Two times a day (BID) | ORAL | 3 refills | Status: DC

## 2016-01-13 NOTE — Progress Notes (Signed)
SUBJECTIVE:  No complaints  OBJECTIVE:   Vitals:   Vitals:   01/12/16 1300 01/12/16 2050 01/13/16 0421 01/13/16 0858  BP: 116/66 121/62 109/64 (!) 112/57  Pulse: 68 66 64 72  Resp: 18 20 20    Temp: 98.6 F (37 C) 98 F (36.7 C) 98 F (36.7 C)   TempSrc: Oral Oral Oral   SpO2: 100% 100% 100%   Weight:      Height:       I&O's:   Intake/Output Summary (Last 24 hours) at 01/13/16 1051 Last data filed at 01/13/16 [id number]  Gross per 24 hour  Intake              240 ml  Output              650 ml  Net             -410 ml   TELEMETRY: Reviewed telemetry pt in NSR:     PHYSICAL EXAM General: Well developed, well nourished, in no acute distress Head: Eyes PERRLA, No xanthomas.   Normal cephalic and atramatic  Lungs:   Clear bilaterally to auscultation and percussion. Heart:   HRRR S1 S2 Pulses are 2+ & equal. Abdomen: Bowel sounds are positive, abdomen soft and non-tender without masses  Msk:  Back normal, normal gait. Normal strength and tone for age. Extremities:   No clubbing, cyanosis or edema.  DP +1 Neuro: Alert and oriented X 3. Psych:  Good affect, responds appropriately   LABS: Basic Metabolic Panel:  Recent Labs  56/21/3008/31/17 1811 01/12/16 0006  NA 137 138  K 4.3 3.9  CL 106 105  CO2 23 23  GLUCOSE 92 93  BUN 10 9  CREATININE 0.74 0.73  CALCIUM 9.2 9.1   Liver Function Tests:  Recent Labs  01/11/16 0909  AST 26  ALT 19  ALKPHOS 47  BILITOT 0.5  PROT 6.1*  ALBUMIN 3.6   No results for input(s): LIPASE, AMYLASE in the last 72 hours. CBC:  Recent Labs  01/11/16 0908  01/11/16 1228 01/11/16 1811 01/12/16 0006  WBC 4.1  --  11.3*  --  6.1  NEUTROABS 2.3  --   --   --   --   HGB 12.2  < > 13.4  --  12.3  HCT 38.2  < > 41.9  --  39.2  MCV 84.5  --  85.7  --  84.8  PLT 226  --  236 267 239  < > = values in this interval not displayed. Cardiac Enzymes:  Recent Labs  01/11/16 1228 01/11/16 1811 01/12/16 0006  TROPONINI 11.33* 25.83*  19.55*   BNP: Invalid input(s): POCBNP D-Dimer: No results for input(s): DDIMER in the last 72 hours. Hemoglobin A1C: No results for input(s): HGBA1C in the last 72 hours. Fasting Lipid Panel:  Recent Labs  01/12/16 0006  CHOL 121  HDL 57  LDLCALC 54  TRIG 49  CHOLHDL 2.1   Thyroid Function Tests: No results for input(s): TSH, T4TOTAL, T3FREE, THYROIDAB in the last 72 hours.  Invalid input(s): FREET3 Anemia Panel: No results for input(s): VITAMINB12, FOLATE, FERRITIN, TIBC, IRON, RETICCTPCT in the last 72 hours. Coag Panel:   Lab Results  Component Value Date   INR 1.10 01/11/2016    RADIOLOGY: Dg Chest Port 1 View  Result Date: 01/11/2016 CLINICAL DATA:  Chest pain for 1 day EXAM: PORTABLE CHEST 1 VIEW COMPARISON:  None. FINDINGS: Lungs are clear. Heart size and pulmonary vascularity  are normal. No adenopathy. No pneumothorax. No bone lesions. IMPRESSION: No edema or consolidation. Electronically Signed   By: Bretta Bang III M.D.   On: 01/11/2016 09:25    ASSESSMENT AND PLAN: 1. 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 No significant stenosis in LAD or RCA.   Chest pain resolved. Peak troponin 25.83. Ecg improved. Plan to continue ASA, Brilinta, high dose statin (although if this is truly SCAD, utility of high dose statin unknown but will continue Rx for now as ulcerated plaque cannot be fully ruled out) and metoprolol. 2D echo with EF 45-50%. 2. Hyperlipidemia on high dose statin. LDL 54. Needs FLP and ALT in 6 weeks.  3. Remote stent of mid LAD  Patient has been ambulating in the hall without any problems.  No chest pain or SOB.  Labs and VS stable.  Stable for discharge home with follow up with Dr. Okey Dupre in [place] post DC.    Armanda Magic, MD  01/13/2016  10:51 AM

## 2016-01-13 NOTE — Progress Notes (Signed)
CARDIAC REHAB PHASE I   PRE:  Rate/Rhythm: 66 SR  BP:  Supine: 94/74  Sitting:   Standing:    SaO2:   MODE:  Ambulation: 740 ft   POST:  Rate/Rhythm: 83 SR  BP:  Supine:   Sitting: 102/70  Standing:    SaO2:  0950-1005 Pt walked 740 ft with steady gait. No CP. Tolerated well.   Luetta Nuttingharlene Nancie Bocanegra, RN BSN  01/13/2016 10:02 AM

## 2016-01-13 NOTE — Progress Notes (Signed)
Discharge instructions reviewed with patient and spouse. CCMD notifed, IV removed, patient given paper rx given to patient. Casper HarrisonSamantha K Tekeya Geffert, RN

## 2016-01-13 NOTE — Discharge Summary (Signed)
Discharge Summary    Patient ID: Tamara [name],  MRN: [id number], DOB/AGE: 07/27/1960 55 y.o.  Admit date: 01/11/2016 Discharge date: 01/13/2016  Primary Care Provider: No primary care provider on file. Primary Cardiologist: Dr. Okey DupreEnd  Discharge Diagnoses    Principal Problem:   Acute ST elevation myocardial infarction (STEMI) of posterior wall (HCC) Active Problems:   CAD in native artery   Hyperlipidemia   Cardiomyopathy, ischemic    Diagnostic Studies/Procedures    1. Cardiac catheterization this admission, please see full report and below for summary. 2. 2D Echo 01/12/16 - Study Conclusions - Left ventricle: Mid and basal inferior wall hypokinesis. The   cavity size was mildly dilated. Wall thickness was normal.   Systolic function was mildly reduced. The estimated ejection   fraction was in the range of 45% to 50%. - Atrial septum: No defect or patent foramen ovale was identified. - Pulmonary arteries: PA peak pressure: 31 mm Hg (S).  _____________   History of Present Illness & Hospital Course    Mrs. Tamara FinlandRobertson is a 55 yo female with PMH of HLD and CAD s/[facility]. In review of her records she underwent myocardial perfusion testing in 3/16 showing no evidence of ischemia or scarring.  Home medications included ASA 81mg , and high dose statin. She had reported an intolerance to metoprolol previously, but reported willing to resume if indicated. Her EF at that time was noted at >65%. She was admitted 01/11/16 with sudden onset of centralized chest pain with radiation into her back similar to those she experienced with her last stent. [facility] and given 324 ASA with 3 SL nitro with no relief. In the ED she continued to report ongoing chest pain and was given 100mcg of fentanyl, along with a 4000 unit bolus of heparin. EKG showed ST depression in leads V1-3 with ST elevation in lead V6 consistent with acute  posterior STEMI. She was brought emergently to the cath lab. Cath showed:  1.  Posterior STEMI with likely spontaneous dissection of the proximal LCx and occlusion of OM1.  Acute plaque rupture with thrombotic occlusion of OM1 is also a consideration but felt less likely. 2.  Successful PCI to proximal/mid LCx and OM1 using a DK Crush bifurcation stenting technique, with placement of a Promus Premier 3.0 x 32 mm drug-eluting stent in the LCx and Promus Premier 2.25 x 20 mm drug-eluting stent in OM1.  Due to propagation of dissection distal to the primary LCx stent, a Promus Premier 2.25 x 12 mm drug-eluting stent was placed distal to the initial LCx stent.  Final angiogram reveals stable 40% stenosis at the ostium of the LCx with otherwise no stenosis and TIMI 3 flow. 3.  Mild elevated left ventricular filling pressure.  DAPT was recommended - was started on Brilinta (rec'd 30 day free card + usual refills). Troponiin peak was 25. She was continued on atorvastatin 80mg  daily (prior home med for her). Low dose metoprolol was started and patient tolerated while inpatient. 2D Echo 01/12/16: mid and basal inferior wall hypokinesis, EF 45-50%, PASP 31mmHg. She tolerated the cath without acute complication. Dr. Mayford Knifeurner has seen and examined the patient today and feels he is stable for discharge. I have sent a message to our [facility]'s scheduler requesting a 1 week TOC follow-up appointment, and our office will call the patient with this information. There was initial recommendation to repeat FLP/lipids in  6 weeks given statin initiation but she was actually on this at home so will defer to primary cardiologist.  She was given these special instructions based on her home med rec: "- Do not take any extra aspirin while taking Brilinta - any doses over 81mg  per day can cause Brilinta not to work as well. - Patients taking blood thinners should generally stay away from medicines like ibuprofen, Advil, Motrin,  naproxen, and Aleve due to risk of stomach bleeding. You may take Tylenol as directed or talk to your primary doctor about alternatives.  - Do not take any products containing pseudoephedrine given your heart history - if you get cold symptoms, can contact our office to discuss OTC products that are safer given your history."  _____________  Discharge Vitals Blood pressure 119/65, pulse 73, temperature 97.5 F (36.4 C), temperature source Oral, resp. rate 18, height 5\' 4"  (1.626 m), weight 146 lb 13.2 oz (66.6 kg), SpO2 100 %.  Filed Weights   01/11/16 0910 01/11/16 1200  Weight: 146 lb (66.2 kg) 146 lb 13.2 oz (66.6 kg)    Labs & Radiologic Studies    CBC  Recent Labs  01/11/16 0908  01/11/16 1228 01/11/16 1811 01/12/16 0006  WBC 4.1  --  11.3*  --  6.1  NEUTROABS 2.3  --   --   --   --   HGB 12.2  < > 13.4  --  12.3  HCT 38.2  < > 41.9  --  39.2  MCV 84.5  --  85.7  --  84.8  PLT 226  --  236 267 239  < > = values in this interval not displayed. Basic Metabolic Panel  Recent Labs  01/11/16 1811 01/12/16 0006  NA 137 138  K 4.3 3.9  CL 106 105  CO2 23 23  GLUCOSE 92 93  BUN 10 9  CREATININE 0.74 0.73  CALCIUM 9.2 9.1   Liver Function Tests  Recent Labs  01/11/16 0909  AST 26  ALT 19  ALKPHOS 47  BILITOT 0.5  PROT 6.1*  ALBUMIN 3.6   No results for input(s): LIPASE, AMYLASE in the last 72 hours. Cardiac Enzymes  Recent Labs  01/11/16 1228 01/11/16 1811 01/12/16 0006  TROPONINI 11.33* 25.83* 19.55*   Fasting Lipid Panel  Recent Labs  01/12/16 0006  CHOL 121  HDL 57  LDLCALC 54  TRIG 49  CHOLHDL 2.1    _____________  Dg Chest Port 1 View  Result Date: 01/11/2016 CLINICAL DATA:  Chest pain for 1 day EXAM: PORTABLE CHEST 1 VIEW COMPARISON:  None. FINDINGS: Lungs are clear. Heart size and pulmonary vascularity are normal. No adenopathy. No pneumothorax. No bone lesions. IMPRESSION: No edema or consolidation. Electronically Signed   By:  Bretta Bang III M.D.   On: 01/11/2016 09:25   Disposition   Pt is being discharged home today in good condition.  Follow-up Plans & Appointments    Follow-up Information    [name], MD .   Specialty:  Cardiology Why:  Dr. Serita Kyle office will call you with a follow-up appointment - call office if you haven't heard from [place] within 3 days. Contact information: [address] [place] [address] [phone]          Discharge Instructions    AMB Referral to Cardiac Rehabilitation - Phase II    Complete by:  As directed   Diagnosis:  STEMI   Amb Referral to Cardiac Rehabilitation  Complete by:  As directed   Diagnosis:   Coronary Stents STEMI     Diet - low sodium heart healthy    Complete by:  As directed   Increase activity slowly    Complete by:  As directed   No driving for 2 weeks. No lifting over 10 lbs for 4 weeks. No sexual activity for 4 weeks. You may not return to work until cleared by your cardiologist. Keep procedure site clean & dry. If you notice increased pain, swelling, bleeding or pus, call/return!  You may shower, but no soaking baths/hot tubs/pools for 1 week.   Do not take any extra aspirin while taking Brilinta - any doses over 81mg  per day can cause Brilinta not to work as well. Only take one baby aspirin daily.  Patients taking blood thinners should generally stay away from medicines like ibuprofen, Advil, Motrin, naproxen, and Aleve due to risk of stomach bleeding. You may take Tylenol as directed or talk to your primary doctor about alternatives.   Do not take any products containing pseudoephedrine given your heart history - if you get cold symptoms, can contact our office to discuss OTC products that are safer given your history.  One of your heart tests showed weakness of the heart muscle this admission. This may make you more susceptible to weight gain from fluid retention, which can lead to symptoms that we call heart  failure. Please follow these special instructions:  1. Follow a low-salt diet and watch your fluid intake. In general, you should not be taking in more than 2 liters of fluid per day (no more than 8 glasses per day). Some patients are restricted to less than 1.5 liters of fluid per day (no more than 6 glasses per day). This includes sources of water in foods like soup, coffee, tea, milk, etc. 2. Weigh yourself on the same scale at same time of day and keep a log. 3. Call your doctor: (Anytime you feel any of the following symptoms)  - 3-4 pound weight gain in 1-2 days or 2 pounds overnight  - Shortness of breath, with or without a dry hacking cough  - Swelling in the hands, feet or stomach  - If you have to sleep on extra pillows at night in order to breathe   IT IS IMPORTANT TO LET YOUR DOCTOR KNOW EARLY ON IF YOU ARE HAVING SYMPTOMS SO WE CAN HELP YOU!      Discharge Medications     Medication List    STOP taking these medications   ibuprofen 800 MG tablet Commonly known as:  ADVIL,MOTRIN   naproxen 500 MG EC tablet Commonly known as:  EC NAPROSYN   NYQUIL MULTI-SYMPTOM PO     TAKE these medications   aspirin EC 81 MG tablet Take 81 mg by mouth daily. FOR HEART HEALTH What changed:  Another medication with the same name was removed. Continue taking this medication, and follow the directions you see here.   atorvastatin 80 MG tablet Commonly known as:  LIPITOR Take 80 mg by mouth at bedtime.   cyclobenzaprine 10 MG tablet Commonly known as:  FLEXERIL Take 10 mg by mouth 3 (three) times daily as needed for muscle spasms.   diclofenac sodium 1 % Gel Commonly known as:  VOLTAREN Apply 2 g topically 4 (four) times daily.   Fluocinonide 0.1 % Crea Apply 1 application topically daily.   fluticasone 50 MCG/ACT nasal spray Commonly known as:  FLONASE Place 2 sprays into  both nostrils daily as needed for allergies.   metoprolol tartrate 25 MG tablet Commonly known as:   LOPRESSOR Take 0.5 tablets (12.5 mg total) by mouth 2 (two) times daily.   nitroGLYCERIN 0.4 MG SL tablet Commonly known as:  NITROSTAT Place 0.4 mg under the tongue every 5 (five) minutes as needed for chest pain.   ONE-A-DAY WOMENS 50+ ADVANTAGE Tabs Take 1 tablet by mouth daily.   STOOL SOFTENER 100 MG capsule Generic drug:  docusate sodium Take 100 mg by mouth daily as needed for constipation.   ticagrelor 90 MG Tabs tablet Commonly known as:  BRILINTA Take 1 tablet (90 mg total) by mouth 2 (two) times daily.   traZODone 50 MG tablet Commonly known as:  DESYREL Take 50 mg by mouth at bedtime.   VITAMIN D-3 PO Take 1 tablet by mouth daily.   VITAMIN E PO Take 1 capsule by mouth daily.       Allergies:  Allergies  Allergen Reactions  . Penicillins Rash    Has patient had a PCN reaction causing immediate rash, facial/tongue/throat swelling, SOB or lightheadedness with hypotension: Yes Has patient had a PCN reaction causing severe rash involving mucus membranes or skin necrosis: No Has patient had a PCN reaction that required hospitalization No Has patient had a PCN reaction occurring within the last 10 years: No If all of the above answers are "NO", then may proceed with Cephalosporin use.     Aspirin prescribed at discharge?  Yes High Intensity Statin Prescribed? (Lipitor 40-80mg  or Crestor 20-40mg ): Yes Beta Blocker Prescribed? Yes For EF <40%, was ACEI/ARB Prescribed? No: na ADP Receptor Inhibitor Prescribed? (i.e. Plavix etc.-Includes Medically Managed Patients): Yes For EF <40%, Aldosterone Inhibitor Prescribed? No: na Was EF assessed during THIS hospitalization? Yes Was Cardiac Rehab II ordered? (Included Medically managed Patients): Yes   Outstanding Labs/Studies   N/a. There was initial recommendation to repeat FLP/lipids in 6 weeks given statin initiation but she was actually on this at home so will defer to primary cardiologist.  Duration of  Discharge Encounter   Greater than 30 minutes including physician time.  Signed, Laurann Montana PA-C 01/13/2016, 2:36 PM

## 2016-01-18 ENCOUNTER — Telehealth: Payer: Self-pay | Admitting: Internal Medicine

## 2016-01-18 NOTE — Telephone Encounter (Signed)
Patient spouse dropped off FMLA forms from Employer to be completed.  Given [employer] letter, & ROI consent to be completed with $25 fee prior to sending forms.  Scanned forms into patient documents tab.   Will send via fax/ office mail when roi complete with fee.

## 2016-01-19 NOTE — Telephone Encounter (Signed)
Received roi and fee. Faxed and mailed to [employer].

## 2016-01-21 NOTE — Progress Notes (Signed)
Follow-up Outpatient Visit Date: 01/22/2016  CC: Follow-up coronary artery disease after recent STEMI  HPI:  Tamara [name] is a 55 y.o. year-old female with history of coronary artery disease s/p PCI to mid LAD in 2008 and recent posterior STEMI with DES x 3 to LCx and OM, ischemic cardiomyopathy with LVEF 45-50%, and hyperlipidemia who presents for follow-up of recent MI (d[facility]).  She presented via EMS on 01/11/16 with acute onset of chest pain.  EKG in the ED revealed ST-depression in V2-4 and subtle ST-elevation in V6.  Emergent LHC showed occlusion of OM1 as well as hazy lesion in proximal LCx concerning for SCAD versus ulcerated plaque with thrombus.  Bifurcation stenting using DK-crush technique was performed with restoration of TIMI-3 flow in LCx and OM1 with 0% residual stenosis.  Today, the patient reports that she has not had any further episodes of chest pain. She experienced one brief episode of discomfort in the right shoulder/upper back yesterday evening at rest, which resolved spontaneously. Her primary concern is of weakness and pain involving the left hand and arm. This has been present since she left the hospital. She notes a throbbing in the left hand that is worse at night. Activity seemed to improve the pain. She also feels as though her arm is not quite as strong. She has not had any other focal neurologic deficits, including other areas of weakness, speech difficulty, vision changes, or numbness.  She denies trauma to the left arm and neck, though she has a history of chronic back problems.  The patient remains on dual antiplatelet therapy with aspirin and ticagrelor, which she is tolerating well. She has chronic hip pain but otherwise no myalgias. She attributes the pain to bursitis for which she has been [facility]. She has been walking regularly up to 1/2 mile. She notes feeling exhausted at the Lyndall Bellot of her walk but does not have  significant exertional dyspnea or chest pain. She also denies lower extremity edema, orthopnea and PND (though she sleeps in a recliner at baseline due to low back pain. She denies palpitations. She has occasional brief episodes of orthostatic lightheadedness. She has not fallen.  --------------------------------------------------------------------------------------------------  Past Medical History:  Diagnosis Date  . CAD in native artery    a. PCI with DES to LAD (2008). b. Post STEMI 12/2015 2/2 complex LCx occ with occ of large OM1 and SCAD vs ulcerated plaque of Prox LCx s/p complex stenting of LCx/large OM1.  . Hyperlipemia   . Ischemic cardiomyopathy    a. EF 45-50% by admission 12/2015.  Marland Kitchen. Myocardial infarct (HCC) 05/13/2006   a. PCI with DES to LAD (2008)    Past Surgical History:  Procedure Laterality Date  . CARDIAC CATHETERIZATION N/A 01/11/2016   Procedure: Left Heart Cath and Coronary Angiography;  Surgeon: Yvonne Kendallhristopher Baer Hinton, MD;  [facility];  Service: Cardiovascular;  Laterality: N/A;  . CARDIAC CATHETERIZATION N/A 01/11/2016   Procedure: Coronary Stent Intervention;  Surgeon: Yvonne Kendallhristopher Zaakirah Kistner, MD;  Location: [facility] INVASIVE CV LAB;  Service: Cardiovascular;  Laterality: N/A;  PROX/ DIST CX AND OM1    Outpatient Encounter Prescriptions as of 01/22/2016  Medication Sig  . aspirin EC 81 MG tablet Take 81 mg by mouth daily. FOR HEART HEALTH  . atorvastatin (LIPITOR) 80 MG tablet Take 80 mg by mouth at bedtime.  . Cholecalciferol (VITAMIN D-3 PO) Take 1 tablet by mouth daily.  . cyclobenzaprine (FLEXERIL) 10 MG tablet Take  10 mg by mouth 3 (three) times daily as needed for muscle spasms.  . diclofenac sodium (VOLTAREN) 1 % GEL Apply 2 g topically 4 (four) times daily.  Marland Kitchen docusate sodium (STOOL SOFTENER) 100 MG capsule Take 100 mg by mouth daily as needed for constipation.  . Fluocinonide 0.1 % CREA Apply 1 application topically daily.  . fluticasone (FLONASE) 50  MCG/ACT nasal spray Place 2 sprays into both nostrils daily as needed for allergies.  . metoprolol tartrate (LOPRESSOR) 25 MG tablet Take 0.5 tablets (12.5 mg total) by mouth 2 (two) times daily.  . Multiple Vitamins-Minerals (ONE-A-DAY WOMENS 50+ ADVANTAGE) TABS Take 1 tablet by mouth daily.  . nitroGLYCERIN (NITROSTAT) 0.4 MG SL tablet Place 0.4 mg under the tongue every 5 (five) minutes as needed for chest pain.  . ticagrelor (BRILINTA) 90 MG TABS tablet Take 1 tablet (90 mg total) by mouth 2 (two) times daily.  . traZODone (DESYREL) 50 MG tablet Take 50 mg by mouth at bedtime.  Marland Kitchen VITAMIN E PO Take 1 capsule by mouth daily.   No facility-administered encounter medications on file as of 01/22/2016.    Allergies: Penicillins  Social History   Social History  . Marital status: Married    Spouse name: N/A  . Number of children: N/A  . Years of education: N/A   Occupational History  . Not on file.   Social History Main Topics  . Smoking status: Never Smoker  . Smokeless tobacco: Never Used  . Alcohol use No  . Drug use: No  . Sexual activity: Not on file   Other Topics Concern  . Not on file   Social History Narrative  . No narrative on file    Family History  Problem Relation Age of Onset  . Diabetes Mother   . CAD Father   . Polycystic kidney disease Father   . Aneurysm Sister   . Polycystic kidney disease Brother     Review of Systems: A 12-system review of systems was performed and was negative except as noted in the HPI.  --------------------------------------------------------------------------------------------------  PHYSICAL EXAM: BP 100/62 (BP Location: Left Arm, Patient Position: Sitting, Cuff Size: Normal)   Pulse 63   Ht 5\' 4"  (1.626 m)   Wt 143 lb (64.9 kg)   BMI 24.55 kg/m   General:  Well-developed, well nourished female, seated comfortably in the exam room.She is accompanied by her husband. HEENT: No conjunctival pallor or scleral icterus.   Moist mucous membranes.  OP clear. Neck: Supple without lymphadenopathy, thyromegaly, JVD, or HJR.  No carotid bruit. Lungs: Normal work of breathing.  Clear to auscultation bilaterally without wheezes or crackles. CV: Regular rate and rhythm without murmurs, rubs, or gallops.  Non-displaced PMI. Abd: Bowel sounds present.  Soft, NT/ND without hepatosplenomegaly Ext: No lower extremity edema.  Radial pulses 1+ with normal Allen's test. Left radial pulses 2+. Pedal pulses are 2+ bilaterally. Skin: Mild bruising along dorsum of left hand/forearm as well as overlying the right radial arteriotomy. Arteriotomy site is well-healed. Neuro: Cranial nerves III-XII intact. 4+/5 left hand grip and elbow flexion/extension. 5/5 left shoulder abduction and abduction. 5/5 right upper extremity and bilateral lower extremity strength. Fine touch sensation slightly different along left forearm and hand.  EKG:  Normal sinus rhythm with inferolateral T waves, not significanty changed from prior tracing.  --------------------------------------------------------------------------------------------------  ASSESSMENT AND PLAN: 1. Acute ST elevation myocardial infarction (STEMI) of posterior wall (HCC) with CAD in native artery No further episodes of chest pain  since leaving the hospital. Upper back/right shoulder pain and left arm pain are atypical and unlikely to be due to obstructive CAD. The patient is tolerating dual antiplatelet therapy with aspirin and ticagrelor. We will continue this for at least 12 months, ideally longer given that she now has 4 coronary stents, including a bifurcation stenting. We will also continue with atorvastatin 80 mg daily and low-dose metoprolol. We discussed switching to carvedilol given her fatigue, but the patient declined. Her low blood pressure and intermittent lightheadedness may be hesitant to escalate this further. The patient was encouraged to participate in cardiac rehabilitation.  I think it is reasonable for her to return back to work from a cardiac standpoint next week.  2. Cardiomyopathy, ischemic The patient appears euvolemic and well compensated with NYHA class II symptoms. Her echocardiogram shortly after STEMI revealed mildly reduced LV systolic function. We will continue with low-dose metoprolol at this time. Her soft blood pressure precludes addition of an ACE inhibitor for now. We will reassess her symptoms when she returns for follow-up and consider repeating an echocardiogram to evaluate for improvement in her LV function.  3. Left arm pain It is possible that she may be experiencing pain from multiple punctures to the left arm during her hospitalization or neuropathy related to left arm positioning during or shortly after her hospitalization. She has subtle weakness involving the left forearm and hand but otherwise no focal neurologic deficits. We discussed the possibility for post-PCI CVA, though the time course and waxing and waning nature of the pain and weakness would be unusual for stroke. We discussed further workup including referral to the ED for intracranial imaging and carotid Dopplers; the patient and her husband were hesitant to do this given that Ms. Hochberg feels well otherwise. I strongly encouraged her to see her PCP in the next 1 to 2 days for further evaluation. If she has any new weakness, pain, or other neurologic changes, the patient was advised to seek immediate medical attention.  Follow-up: Return to clinic in 2 months.  [name], MD 01/22/2016 11:28 AM

## 2016-01-22 ENCOUNTER — Encounter: Payer: Self-pay | Admitting: Internal Medicine

## 2016-01-22 ENCOUNTER — Ambulatory Visit (INDEPENDENT_AMBULATORY_CARE_PROVIDER_SITE_OTHER): Payer: 59 | Admitting: Internal Medicine

## 2016-01-22 VITALS — BP 100/62 | HR 63 | Ht 64.0 in | Wt 143.0 lb

## 2016-01-22 DIAGNOSIS — I2510 Atherosclerotic heart disease of native coronary artery without angina pectoris: Secondary | ICD-10-CM

## 2016-01-22 DIAGNOSIS — I255 Ischemic cardiomyopathy: Secondary | ICD-10-CM

## 2016-01-22 DIAGNOSIS — M79602 Pain in left arm: Secondary | ICD-10-CM

## 2016-01-22 DIAGNOSIS — I2129 ST elevation (STEMI) myocardial infarction involving other sites: Secondary | ICD-10-CM | POA: Diagnosis not present

## 2016-01-22 NOTE — Patient Instructions (Signed)
Make sure to follow up with your primary care physician in the next few days regarding left arm pain.  Follow-Up: Your physician recommends that you schedule a follow-up appointment in: 2 months with Dr. Okey DupreEnd.   It was a pleasure seeing you today here in the office. Please do not hesitate to give us a call back if you have any further questions. [phone]  Leachville CellarPamela A. RN, BSN

## 2016-01-25 ENCOUNTER — Telehealth: Payer: Self-pay | Admitting: Internal Medicine

## 2016-01-25 NOTE — Telephone Encounter (Signed)
Pt c/o BP issue: STAT if pt c/o blurred vision, one-sided weakness or slurred speech  1. What are your last 5 BP readings?  01/23/16:  8 PM104/52 HR 75 9 PM 94/55 HR 67  10 PM 100/49 HR 73 01/24/16 10 PM 94/52 HR 72 12 AM 88/48 HR 66 01/25/16 9 AM 95/52 HR 68  2. Are you having any other symptoms (ex. Dizziness, headache, blurred vision, passed out)?   No pt is just very weak, low energy   3. What is your BP issue?   She is concern that these reading are what they are supposed to be for her

## 2016-01-25 NOTE — Telephone Encounter (Signed)
This pt is followed by Dr End in the [place] office.  Will route this message to the appropriate pool for further review and follow-up with the pt.

## 2016-01-26 NOTE — Telephone Encounter (Signed)
Pt reports low BP 9/12-9/14. Readings are listed below. She denies any other sx. She takes metoprolol 12.5mg  BID as prescribed at [facility]2 [facility]. Today, BP 105/48. HR stays upper 60s - upper 70s.  Reviewed parameters when she should hold metoprolol. Pt will continue to monitor BP prior to taking medication. She will then call our office next week to report if/how frequently she has had to hold metoprolol. She is agreeable w/plan, verbalized understanding and had no further questions at this time.

## 2016-01-29 NOTE — Telephone Encounter (Signed)
Patient forms returned from [employer] for md review and signature. Placed in nurse box.  °

## 2016-01-30 ENCOUNTER — Telehealth: Payer: Self-pay | Admitting: Internal Medicine

## 2016-01-30 NOTE — Telephone Encounter (Signed)
Pt FMLA/Disability Form for [employer] signed by MD and given to Sandre KittySabrina Gilley

## 2016-02-05 ENCOUNTER — Encounter: Payer: 59 | Attending: Internal Medicine | Admitting: *Deleted

## 2016-02-05 VITALS — BP 118/60 | HR 70 | Ht 63.8 in | Wt 144.9 lb

## 2016-02-05 DIAGNOSIS — I213 ST elevation (STEMI) myocardial infarction of unspecified site: Secondary | ICD-10-CM

## 2016-02-05 DIAGNOSIS — Z955 Presence of coronary angioplasty implant and graft: Secondary | ICD-10-CM | POA: Insufficient documentation

## 2016-02-05 NOTE — Patient Instructions (Signed)
Patient Instructions  Patient Details  Name: Tamara [name] MRN: [id number] Date of Birth: 30-Apr-1961 Referring Provider:  [name], MD  Below are the personal goals you chose as well as exercise and nutrition goals. Our goal is to help you keep on track towards obtaining and maintaining your goals. We will be discussing your progress on these goals with you throughout the program.  Initial Exercise Prescription:     Initial Exercise Prescription - 02/05/16 1500      Date of Initial Exercise RX and Referring Provider   Date 02/05/16   Referring Provider End     Treadmill   MPH 3.2   Grade 1   Minutes 15   METs 3.89     Recumbant Bike   Level 2   RPM 60   Minutes 15   METs 4     NuStep   Level 3   Minutes 15   METs 4     Recumbant Elliptical   Level 2   RPM 50   Minutes 15   METs 4     Elliptical   Level 1   Speed 4   Minutes 15   METs 4     REL-XR   Level 3   Minutes 15   METs 4     Prescription Details   Frequency (times per week) 3   Duration Progress to 45 minutes of aerobic exercise without signs/symptoms of physical distress     Intensity   THRR 40-80% of Max Heartrate 109-147   Ratings of Perceived Exertion 11-13   Perceived Dyspnea 0-4     Progression   Progression Continue to progress workloads to maintain intensity without signs/symptoms of physical distress.     Resistance Training   Training Prescription Yes   Weight 3   Reps 10-12      Exercise Goals: Frequency: Be able to perform aerobic exercise three times per week working toward 3-5 days per week.  Intensity: Work with a perceived exertion of 11 (fairly light) - 15 (hard) as tolerated. Follow your new exercise prescription and watch for changes in prescription as you progress with the program. Changes will be reviewed with you when they are made.  Duration: You should be able to do 30 minutes of continuous aerobic exercise in addition to a 5 minute warm-up and a 5  minute cool-down routine.  Nutrition Goals: Your personal nutrition goals will be established when you do your nutrition analysis with the dietician.  The following are nutrition guidelines to follow: Cholesterol < 200mg /day Sodium < 1500mg /day Fiber: Women over 50 yrs - 21 grams per day  Personal Goals:     Personal Goals and Risk Factors at Admission - 02/05/16 1300      Core Components/Risk Factors/Patient Goals on Admission   Sedentary Yes   Intervention Provide advice, education, support and counseling about physical activity/exercise needs.;Develop an individualized exercise prescription for aerobic and resistive training based on initial evaluation findings, risk stratification, comorbidities and participant's personal goals.   Expected Outcomes Achievement of increased cardiorespiratory fitness and enhanced flexibility, muscular endurance and strength shown through measurements of functional capacity and personal statement of participant.   Stress Yes   Intervention Offer individual and/or small group education and counseling on adjustment to heart disease, stress management and health-related lifestyle change. Teach and support self-help strategies.;Refer participants experiencing significant psychosocial distress to appropriate mental health specialists for further evaluation and treatment. When possible, include family members and significant others in  education/counseling sessions.   Expected Outcomes Short Term: Participant demonstrates changes in health-related behavior, relaxation and other stress management skills, ability to obtain effective social support, and compliance with psychotropic medications if prescribed.;Long Term: Emotional wellbeing is indicated by absence of clinically significant psychosocial distress or social isolation.      Tobacco Use Initial Evaluation: History  Smoking Status  . Never Smoker  Smokeless Tobacco  . Never Used    Copy of goals  given to participant.

## 2016-02-05 NOTE — Progress Notes (Signed)
Cardiac Individual Treatment Plan  Patient Details  Name: Tamara [name] MRN: [id number] Date of Birth: 01-28-1961 Referring Provider:    Initial Encounter Date:   Visit Diagnosis: ST elevation myocardial infarction (STEMI), unspecified artery (HCC)  Patient's Home Medications on Admission:  Current Outpatient Prescriptions:  .  aspirin EC 81 MG tablet, Take 81 mg by mouth daily. FOR HEART HEALTH, Disp: , Rfl:  .  atorvastatin (LIPITOR) 80 MG tablet, Take 80 mg by mouth at bedtime., Disp: , Rfl:  .  Cholecalciferol (VITAMIN D-3 PO), Take 1 tablet by mouth daily., Disp: , Rfl:  .  cyclobenzaprine (FLEXERIL) 10 MG tablet, Take 10 mg by mouth 3 (three) times daily as needed for muscle spasms., Disp: , Rfl:  .  diclofenac sodium (VOLTAREN) 1 % GEL, Apply 2 g topically 4 (four) times daily., Disp: , Rfl:  .  docusate sodium (STOOL SOFTENER) 100 MG capsule, Take 100 mg by mouth daily as needed for constipation., Disp: , Rfl:  .  Fluocinonide 0.1 % CREA, Apply 1 application topically daily., Disp: , Rfl:  .  fluticasone (FLONASE) 50 MCG/ACT nasal spray, Place 2 sprays into both nostrils daily as needed for allergies., Disp: , Rfl:  .  metoprolol tartrate (LOPRESSOR) 25 MG tablet, Take 0.5 tablets (12.5 mg total) by mouth 2 (two) times daily., Disp: 30 tablet, Rfl: 6 .  Multiple Vitamins-Minerals (ONE-A-DAY WOMENS 50+ ADVANTAGE) TABS, Take 1 tablet by mouth daily., Disp: , Rfl:  .  nitroGLYCERIN (NITROSTAT) 0.4 MG SL tablet, Place 0.4 mg under the tongue every 5 (five) minutes as needed for chest pain., Disp: , Rfl:  .  ticagrelor (BRILINTA) 90 MG TABS tablet, Take 1 tablet (90 mg total) by mouth 2 (two) times daily., Disp: 180 tablet, Rfl: 3 .  traZODone (DESYREL) 50 MG tablet, Take 50 mg by mouth at bedtime., Disp: , Rfl:  .  VITAMIN E PO, Take 1 capsule by mouth daily., Disp: , Rfl:   Past Medical History: Past Medical History:  Diagnosis Date  . CAD in native artery    a. PCI with DES  to LAD (2008). b. Post STEMI 12/2015 2/2 complex LCx occ with occ of large OM1 and SCAD vs ulcerated plaque of Prox LCx s/p complex stenting of LCx/large OM1.  . Hyperlipemia   . Ischemic cardiomyopathy    a. EF 45-50% by admission 12/2015.  Marland Kitchen Myocardial infarct (HCC) 05/13/2006   a. PCI with DES to LAD (2008)    Tobacco Use: History  Smoking Status  . Never Smoker  Smokeless Tobacco  . Never Used    Labs: Recent Review Flowsheet Data    Labs for ITP Cardiac and Pulmonary Rehab Latest Ref Rng & Units 01/11/2016 01/12/2016   Cholestrol 0 - 200 mg/dL - 409   LDLCALC 0 - 99 mg/dL - 54   HDL >81 mg/dL - 57   Trlycerides <191 mg/dL - 49   TCO2 0 - 478 mmol/L 25 -       Exercise Target Goals:    Exercise Program Goal: Individual exercise prescription set with THRR, safety & activity barriers. Participant demonstrates ability to understand and report RPE using BORG scale, to self-measure pulse accurately, and to acknowledge the importance of the exercise prescription.  Exercise Prescription Goal: Starting with aerobic activity 30 plus minutes a day, 3 days per week for initial exercise prescription. Provide home exercise prescription and guidelines that participant acknowledges understanding prior to discharge.  Activity Barriers & Risk Stratification:  Activity Barriers & Cardiac Risk Stratification - 02/05/16 1250      Activity Barriers & Cardiac Risk Stratification   Activity Barriers Back Problems  fell down steps years ago and her back hurts at times.   Cardiac Risk Stratification High      6 Minute Walk:   Initial Exercise Prescription:   Perform Capillary Blood Glucose checks as needed.  Exercise Prescription Changes:   Exercise Comments:   Discharge Exercise Prescription (Final Exercise Prescription Changes):   Nutrition:  Target Goals: Understanding of nutrition guidelines, daily intake of sodium 1500mg , cholesterol 200mg , calories 30% from fat and  7% or less from saturated fats, daily to have 5 or more servings of fruits and vegetables.  Biometrics:    Nutrition Therapy Plan and Nutrition Goals:     Nutrition Therapy & Goals - 02/05/16 1300      Nutrition Therapy   Drug/Food Interactions Statins/Certain Fruits     Personal Nutrition Goals   Comments Zella BallRobin has done Weight Watchers for years. Zella BallRobin does not feel that she needs to meet individually with the Cardiac REhab Registered Dietician.      Intervention Plan   Intervention Prescribe, educate and counsel regarding individualized specific dietary modifications aiming towards targeted core components such as weight, hypertension, lipid management, diabetes, heart failure and other comorbidities.   Expected Outcomes Short Term Goal: Understand basic principles of dietary content, such as calories, fat, sodium, cholesterol and nutrients.;Long Term Goal: Adherence to prescribed nutrition plan.      Nutrition Discharge: Rate Your Plate Scores:   Nutrition Goals Re-Evaluation:   Psychosocial: Target Goals: Acknowledge presence or absence of depression, maximize coping skills, provide positive support system. Participant is able to verbalize types and ability to use techniques and skills needed for reducing stress and depression.  Initial Review & Psychosocial Screening:     Initial Psych Review & Screening - 02/05/16 1301      Initial Review   Current issues with Current Sleep Concerns;Current Stress Concerns     Barriers   Psychosocial barriers to participate in program The patient should benefit from training in stress management and relaxation.     Screening Interventions   Interventions Encouraged to exercise      Quality of Life Scores:     Quality of Life - 02/05/16 1257      Quality of Life Scores   Health/Function Pre 22.13 %   Socioeconomic Pre 28.07 %   Psych/Spiritual Pre 23.57 %   Family Pre 27 %   GLOBAL Pre 24.37 %      PHQ-9: Recent  Review Flowsheet Data    [facility] 02/05/2016   Decreased Interest 1   Down, Depressed, Hopeless 1   PHQ - 2 Score 2   Altered sleeping 3   Tired, decreased energy 3   Change in appetite 2   Feeling bad or failure about yourself  1   Trouble concentrating 0   Moving slowly or fidgety/restless 0   Suicidal thoughts 0   PHQ-9 Score 11   Difficult doing work/chores Not difficult at all      Psychosocial Evaluation and Intervention:   Psychosocial Re-Evaluation:   Vocational Rehabilitation: Provide vocational rehab assistance to qualifying candidates.   Vocational Rehab Evaluation & Intervention:     Vocational Rehab - 02/05/16 1258      Initial Vocational Rehab Evaluation & Intervention   Assessment shows need for Vocational Rehabilitation No      Education: Education Goals:  Education classes will be provided on a weekly basis, covering required topics. Participant will state understanding/return demonstration of topics presented.  Learning Barriers/Preferences:     Learning Barriers/Preferences - 02/05/16 1251      Learning Barriers/Preferences   Learning Barriers None   Learning Preferences None      Education Topics: General Nutrition Guidelines/Fats and Fiber: -Group instruction provided by verbal, written material, models and posters to present the general guidelines for heart healthy nutrition. Gives an explanation and review of dietary fats and fiber.   Controlling Sodium/Reading Food Labels: -Group verbal and written material supporting the discussion of sodium use in heart healthy nutrition. Review and explanation with models, verbal and written materials for utilization of the food label.   Exercise Physiology & Risk Factors: - Group verbal and written instruction with models to review the exercise physiology of the cardiovascular system and associated critical values. Details cardiovascular disease risk factors and the goals  associated with each risk factor.   Aerobic Exercise & Resistance Training: - Gives group verbal and written discussion on the health impact of inactivity. On the components of aerobic and resistive training programs and the benefits of this training and how to safely progress through these programs.   Flexibility, Balance, General Exercise Guidelines: - Provides group verbal and written instruction on the benefits of flexibility and balance training programs. Provides general exercise guidelines with specific guidelines to those with heart or lung disease. Demonstration and skill practice provided.   Stress Management: - Provides group verbal and written instruction about the health risks of elevated stress, cause of high stress, and healthy ways to reduce stress.   Depression: - Provides group verbal and written instruction on the correlation between heart/lung disease and depressed mood, treatment options, and the stigmas associated with seeking treatment.   Anatomy & Physiology of the Heart: - Group verbal and written instruction and models provide basic cardiac anatomy and physiology, with the coronary electrical and arterial systems. Review of: AMI, Angina, Valve disease, Heart Failure, Cardiac Arrhythmia, Pacemakers, and the ICD.   Cardiac Procedures: - Group verbal and written instruction and models to describe the testing methods done to diagnose heart disease. Reviews the outcomes of the test results. Describes the treatment choices: Medical Management, Angioplasty, or Coronary Bypass Surgery.   Cardiac Medications: - Group verbal and written instruction to review commonly prescribed medications for heart disease. Reviews the medication, class of the drug, and side effects. Includes the steps to properly store meds and maintain the prescription regimen.   Go Sex-Intimacy & Heart Disease, Get SMART - Goal Setting: - Group verbal and written instruction through game format to  discuss heart disease and the return to sexual intimacy. Provides group verbal and written material to discuss and apply goal setting through the application of the S.M.A.R.T. Method.   Other Matters of the Heart: - Provides group verbal, written materials and models to describe Heart Failure, Angina, Valve Disease, and Diabetes in the realm of heart disease. Includes description of the disease process and treatment options available to the cardiac patient.   Exercise & Equipment Safety: - Individual verbal instruction and demonstration of equipment use and safety with use of the equipment. Flowsheet Row Cardiac Rehab from 02/05/2016 in [facility] Cardiac and Pulmonary Rehab  Date  02/05/16  Educator  C. EnterkinRN  Instruction Review Code  1- partially meets, needs review/practice      Infection Prevention: - Provides verbal and written material to individual with discussion of infection control  including proper hand washing and proper equipment cleaning during exercise session. Flowsheet Row Cardiac Rehab from 02/05/2016 in [facility] Cardiac and Pulmonary Rehab  Date  02/05/16  Educator  C. EnterkinRN  Instruction Review Code  2- meets goals/outcomes      Falls Prevention: - Provides verbal and written material to individual with discussion of falls prevention and safety. Flowsheet Row Cardiac Rehab from 02/05/2016 in [facility] Cardiac and Pulmonary Rehab  Date  02/05/16  Educator  C. EnterkinRN  Instruction Review Code  2- meets goals/outcomes      Diabetes: - Individual verbal and written instruction to review signs/symptoms of diabetes, desired ranges of glucose level fasting, after meals and with exercise. Advice that pre and post exercise glucose checks will be done for 3 sessions at entry of program.    Knowledge Questionnaire Score:     Knowledge Questionnaire Score - 02/05/16 1258      Knowledge Questionnaire Score   Pre Score 26      Core Components/Risk Factors/Patient Goals  at Admission:     Personal Goals and Risk Factors at Admission - 02/05/16 1300      Core Components/Risk Factors/Patient Goals on Admission   Sedentary Yes   Intervention Provide advice, education, support and counseling about physical activity/exercise needs.;Develop an individualized exercise prescription for aerobic and resistive training based on initial evaluation findings, risk stratification, comorbidities and participant's personal goals.   Expected Outcomes Achievement of increased cardiorespiratory fitness and enhanced flexibility, muscular endurance and strength shown through measurements of functional capacity and personal statement of participant.   Stress Yes   Intervention Offer individual and/or small group education and counseling on adjustment to heart disease, stress management and health-related lifestyle change. Teach and support self-help strategies.;Refer participants experiencing significant psychosocial distress to appropriate mental health specialists for further evaluation and treatment. When possible, include family members and significant others in education/counseling sessions.   Expected Outcomes Short Term: Participant demonstrates changes in health-related behavior, relaxation and other stress management skills, ability to obtain effective social support, and compliance with psychotropic medications if prescribed.;Long Term: Emotional wellbeing is indicated by absence of clinically significant psychosocial distress or social isolation.      Core Components/Risk Factors/Patient Goals Review:    Core Components/Risk Factors/Patient Goals at Discharge (Final Review):    ITP Comments:     ITP Comments    Row Name 02/05/16 1300           ITP Comments Adonai has done Weight Watchers for years. Teeghan does not feel that she needs to meet individually with the Cardiac REhab Registered Dietician.           Comments:

## 2016-02-05 NOTE — Progress Notes (Signed)
Daily Session Note  Patient Details  Name: ZALMA CHANNING MRN: [id number] Date of Birth: 12-08-1960 Referring Provider:    Encounter Date: 02/05/2016  Check In:     Session Check In - 02/05/16 1258      Check-In   Location [facility]-Cardiac & Pulmonary Rehab   Staff Present Gerlene Burdock, RN, BSN;Susanne Bice, RN, BSN, Laveda Norman, BS, ACSM CEP, Exercise Physiologist   Supervising physician immediately available to respond to emergencies See telemetry face sheet for immediately available ER MD   Medication changes reported     No   Fall or balance concerns reported    No   Warm-up and Cool-down Not performed (comment)   Resistance Training Performed No   VAD Patient? No     Pain Assessment   Currently in Pain? No/denies         Goals Met:  Personal goals reviewed No report of cardiac concerns or symptoms  Goals Unmet:  Not Applicable  Comments:     Dr. Emily Filbert is Medical Director for [facility] and [facility].

## 2016-02-05 NOTE — Progress Notes (Signed)
Cardiac Individual Treatment Plan  Patient Details  Name: Tamara [name] MRN: [id number] Date of Birth: 08/23/1960 Referring Provider:   Flowsheet Row Cardiac Rehab from 02/05/2016 in [facility] Cardiac and Pulmonary Rehab  Referring Provider  End      Initial Encounter Date:  Flowsheet Row Cardiac Rehab from 02/05/2016 in [facility] Cardiac and Pulmonary Rehab  Date  02/05/16  Referring Provider  End      Visit Diagnosis: ST elevation myocardial infarction (STEMI), unspecified artery (HCC)  Patient's Home Medications on Admission:  Current Outpatient Prescriptions:  .  aspirin EC 81 MG tablet, Take 81 mg by mouth daily. FOR HEART HEALTH, Disp: , Rfl:  .  atorvastatin (LIPITOR) 80 MG tablet, Take 80 mg by mouth at bedtime., Disp: , Rfl:  .  Cholecalciferol (VITAMIN D-3 PO), Take 1 tablet by mouth daily., Disp: , Rfl:  .  cyclobenzaprine (FLEXERIL) 10 MG tablet, Take 10 mg by mouth 3 (three) times daily as needed for muscle spasms., Disp: , Rfl:  .  diclofenac sodium (VOLTAREN) 1 % GEL, Apply 2 g topically 4 (four) times daily., Disp: , Rfl:  .  docusate sodium (STOOL SOFTENER) 100 MG capsule, Take 100 mg by mouth daily as needed for constipation., Disp: , Rfl:  .  Fluocinonide 0.1 % CREA, Apply 1 application topically daily., Disp: , Rfl:  .  fluticasone (FLONASE) 50 MCG/ACT nasal spray, Place 2 sprays into both nostrils daily as needed for allergies., Disp: , Rfl:  .  metoprolol tartrate (LOPRESSOR) 25 MG tablet, Take 0.5 tablets (12.5 mg total) by mouth 2 (two) times daily., Disp: 30 tablet, Rfl: 6 .  Multiple Vitamins-Minerals (ONE-A-DAY WOMENS 50+ ADVANTAGE) TABS, Take 1 tablet by mouth daily., Disp: , Rfl:  .  nitroGLYCERIN (NITROSTAT) 0.4 MG SL tablet, Place 0.4 mg under the tongue every 5 (five) minutes as needed for chest pain., Disp: , Rfl:  .  ticagrelor (BRILINTA) 90 MG TABS tablet, Take 1 tablet (90 mg total) by mouth 2 (two) times daily., Disp: 180 tablet, Rfl: 3 .  traZODone  (DESYREL) 50 MG tablet, Take 50 mg by mouth at bedtime., Disp: , Rfl:  .  VITAMIN E PO, Take 1 capsule by mouth daily., Disp: , Rfl:   Past Medical History: Past Medical History:  Diagnosis Date  . CAD in native artery    a. PCI with DES to LAD (2008). b. Post STEMI 12/2015 2/2 complex LCx occ with occ of large OM1 and SCAD vs ulcerated plaque of Prox LCx s/p complex stenting of LCx/large OM1.  . Hyperlipemia   . Ischemic cardiomyopathy    a. EF 45-50% by admission 12/2015.  [facility] Kitchen Myocardial infarct (HCC) 05/13/2006   a. PCI with DES to LAD (2008)    Tobacco Use: History  Smoking Status  . Never Smoker  Smokeless Tobacco  . Never Used    Labs: Recent Review Flowsheet Data    Labs for ITP Cardiac and Pulmonary Rehab Latest Ref Rng & Units 01/11/2016 01/12/2016   Cholestrol 0 - 200 mg/dL - 409   LDLCALC 0 - 99 mg/dL - 54   HDL >81 mg/dL - 57   Trlycerides <191 mg/dL - 49   TCO2 0 - 478 mmol/L 25 -       Exercise Target Goals: Date: 02/05/16  Exercise Program Goal: Individual exercise prescription set with THRR, safety & activity barriers. Participant demonstrates ability to understand and report RPE using BORG scale, to self-measure pulse accurately, and to acknowledge the  importance of the exercise prescription.  Exercise Prescription Goal: Starting with aerobic activity 30 plus minutes a day, 3 days per week for initial exercise prescription. Provide home exercise prescription and guidelines that participant acknowledges understanding prior to discharge.  Activity Barriers & Risk Stratification:     Activity Barriers & Cardiac Risk Stratification - 02/05/16 1250      Activity Barriers & Cardiac Risk Stratification   Activity Barriers Back Problems  fell down steps years ago and her back hurts at times.   Cardiac Risk Stratification High      6 Minute Walk:     6 Minute Walk    Row Name 02/05/16 1556         6 Minute Walk   Distance 1442 feet     Walk Time 6  minutes     # of Rest Breaks 0     MPH 2.73     METS 4     RPE 9     VO2 Peak 14.2     Symptoms No     Resting HR 70 bpm     Resting BP 118/60     Max Ex. HR 97 bpm     Max Ex. BP 124/82        Initial Exercise Prescription:     Initial Exercise Prescription - 02/05/16 1500      Date of Initial Exercise RX and Referring Provider   Date 02/05/16   Referring Provider End     Treadmill   MPH 3.2   Grade 1   Minutes 15   METs 3.89     Recumbant Bike   Level 2   RPM 60   Minutes 15   METs 4     NuStep   Level 3   Minutes 15   METs 4     Recumbant Elliptical   Level 2   RPM 50   Minutes 15   METs 4     Elliptical   Level 1   Speed 4   Minutes 15   METs 4     REL-XR   Level 3   Minutes 15   METs 4     Prescription Details   Frequency (times per week) 3   Duration Progress to 45 minutes of aerobic exercise without signs/symptoms of physical distress     Intensity   THRR 40-80% of Max Heartrate 109-147   Ratings of Perceived Exertion 11-13   Perceived Dyspnea 0-4     Progression   Progression Continue to progress workloads to maintain intensity without signs/symptoms of physical distress.     Resistance Training   Training Prescription Yes   Weight 3   Reps 10-12      Perform Capillary Blood Glucose checks as needed.  Exercise Prescription Changes:   Exercise Comments:   Discharge Exercise Prescription (Final Exercise Prescription Changes):   Nutrition:  Target Goals: Understanding of nutrition guidelines, daily intake of sodium 1500mg , cholesterol 200mg , calories 30% from fat and 7% or less from saturated fats, daily to have 5 or more servings of fruits and vegetables.  Biometrics:     Pre Biometrics - 02/05/16 1555      Pre Biometrics   Height 5' 3.8" (1.621 m)   Weight 144 lb 14.4 oz (65.7 kg)   Waist Circumference 28.5 inches   Hip Circumference 41 inches   Waist to Hip Ratio 0.7 %   BMI (Calculated) 25.1  Nutrition Therapy Plan and Nutrition Goals:     Nutrition Therapy & Goals - 02/05/16 1300      Nutrition Therapy   Drug/Food Interactions Statins/Certain Fruits     Personal Nutrition Goals   Comments Tamara [name] has done Weight Watchers for years. Tamara [name] does not feel that she needs to meet individually with the Cardiac REhab Registered Dietician.      Intervention Plan   Intervention Prescribe, educate and counsel regarding individualized specific dietary modifications aiming towards targeted core components such as weight, hypertension, lipid management, diabetes, heart failure and other comorbidities.   Expected Outcomes Short Term Goal: Understand basic principles of dietary content, such as calories, fat, sodium, cholesterol and nutrients.;Long Term Goal: Adherence to prescribed nutrition plan.      Nutrition Discharge: Rate Your Plate Scores:   Nutrition Goals Re-Evaluation:   Psychosocial: Target Goals: Acknowledge presence or absence of depression, maximize coping skills, provide positive support system. Participant is able to verbalize types and ability to use techniques and skills needed for reducing stress and depression.  Initial Review & Psychosocial Screening:     Initial Psych Review & Screening - 02/05/16 1301      Initial Review   Current issues with Current Sleep Concerns;Current Stress Concerns     Barriers   Psychosocial barriers to participate in program The patient should benefit from training in stress management and relaxation.     Screening Interventions   Interventions Encouraged to exercise      Quality of Life Scores:     Quality of Life - 02/05/16 1257      Quality of Life Scores   Health/Function Pre 22.13 %   Socioeconomic Pre 28.07 %   Psych/Spiritual Pre 23.57 %   Family Pre 27 %   GLOBAL Pre 24.37 %      PHQ-9: Recent Review Flowsheet Data    Depression screen [facility] 2/9 02/05/2016   Decreased Interest 1   Down, Depressed,  Hopeless 1   PHQ - 2 Score 2   Altered sleeping 3   Tired, decreased energy 3   Change in appetite 2   Feeling bad or failure about yourself  1   Trouble concentrating 0   Moving slowly or fidgety/restless 0   Suicidal thoughts 0   PHQ-9 Score 11   Difficult doing work/chores Not difficult at all      Psychosocial Evaluation and Intervention:   Psychosocial Re-Evaluation:   Vocational Rehabilitation: Provide vocational rehab assistance to qualifying candidates.   Vocational Rehab Evaluation & Intervention:     Vocational Rehab - 02/05/16 1258      Initial Vocational Rehab Evaluation & Intervention   Assessment shows need for Vocational Rehabilitation No      Education: Education Goals: Education classes will be provided on a weekly basis, covering required topics. Participant will state understanding/return demonstration of topics presented.  Learning Barriers/Preferences:     Learning Barriers/Preferences - 02/05/16 1251      Learning Barriers/Preferences   Learning Barriers None   Learning Preferences None      Education Topics: General Nutrition Guidelines/Fats and Fiber: -Group instruction provided by verbal, written material, models and posters to present the general guidelines for heart healthy nutrition. Gives an explanation and review of dietary fats and fiber.   Controlling Sodium/Reading Food Labels: -Group verbal and written material supporting the discussion of sodium use in heart healthy nutrition. Review and explanation with models, verbal and written materials for utilization of the food label.  Exercise Physiology & Risk Factors: - Group verbal and written instruction with models to review the exercise physiology of the cardiovascular system and associated critical values. Details cardiovascular disease risk factors and the goals associated with each risk factor.   Aerobic Exercise & Resistance Training: - Gives group verbal and written  discussion on the health impact of inactivity. On the components of aerobic and resistive training programs and the benefits of this training and how to safely progress through these programs.   Flexibility, Balance, General Exercise Guidelines: - Provides group verbal and written instruction on the benefits of flexibility and balance training programs. Provides general exercise guidelines with specific guidelines to those with heart or lung disease. Demonstration and skill practice provided.   Stress Management: - Provides group verbal and written instruction about the health risks of elevated stress, cause of high stress, and healthy ways to reduce stress.   Depression: - Provides group verbal and written instruction on the correlation between heart/lung disease and depressed mood, treatment options, and the stigmas associated with seeking treatment.   Anatomy & Physiology of the Heart: - Group verbal and written instruction and models provide basic cardiac anatomy and physiology, with the coronary electrical and arterial systems. Review of: AMI, Angina, Valve disease, Heart Failure, Cardiac Arrhythmia, Pacemakers, and the ICD.   Cardiac Procedures: - Group verbal and written instruction and models to describe the testing methods done to diagnose heart disease. Reviews the outcomes of the test results. Describes the treatment choices: Medical Management, Angioplasty, or Coronary Bypass Surgery.   Cardiac Medications: - Group verbal and written instruction to review commonly prescribed medications for heart disease. Reviews the medication, class of the drug, and side effects. Includes the steps to properly store meds and maintain the prescription regimen.   Go Sex-Intimacy & Heart Disease, Get SMART - Goal Setting: - Group verbal and written instruction through game format to discuss heart disease and the return to sexual intimacy. Provides group verbal and written material to discuss  and apply goal setting through the application of the S.M.A.R.T. Method.   Other Matters of the Heart: - Provides group verbal, written materials and models to describe Heart Failure, Angina, Valve Disease, and Diabetes in the realm of heart disease. Includes description of the disease process and treatment options available to the cardiac patient.   Exercise & Equipment Safety: - Individual verbal instruction and demonstration of equipment use and safety with use of the equipment. Flowsheet Row Cardiac Rehab from 02/05/2016 in [facility] Cardiac and Pulmonary Rehab  Date  02/05/16  Educator  C. EnterkinRN  Instruction Review Code  1- partially meets, needs review/practice      Infection Prevention: - Provides verbal and written material to individual with discussion of infection control including proper hand washing and proper equipment cleaning during exercise session. Flowsheet Row Cardiac Rehab from 02/05/2016 in [facility] Cardiac and Pulmonary Rehab  Date  02/05/16  Educator  C. EnterkinRN  Instruction Review Code  2- meets goals/outcomes      Falls Prevention: - Provides verbal and written material to individual with discussion of falls prevention and safety. Flowsheet Row Cardiac Rehab from 02/05/2016 in [facility] Cardiac and Pulmonary Rehab  Date  02/05/16  Educator  C. EnterkinRN  Instruction Review Code  2- meets goals/outcomes      Diabetes: - Individual verbal and written instruction to review signs/symptoms of diabetes, desired ranges of glucose level fasting, after meals and with exercise. Advice that pre and post exercise glucose checks will  be done for 3 sessions at entry of program.    Knowledge Questionnaire Score:     Knowledge Questionnaire Score - 02/05/16 1258      Knowledge Questionnaire Score   Pre Score 26      Core Components/Risk Factors/Patient Goals at Admission:     Personal Goals and Risk Factors at Admission - 02/05/16 1300      Core Components/Risk  Factors/Patient Goals on Admission   Sedentary Yes   Intervention Provide advice, education, support and counseling about physical activity/exercise needs.;Develop an individualized exercise prescription for aerobic and resistive training based on initial evaluation findings, risk stratification, comorbidities and participant's personal goals.   Expected Outcomes Achievement of increased cardiorespiratory fitness and enhanced flexibility, muscular endurance and strength shown through measurements of functional capacity and personal statement of participant.   Stress Yes   Intervention Offer individual and/or small group education and counseling on adjustment to heart disease, stress management and health-related lifestyle change. Teach and support self-help strategies.;Refer participants experiencing significant psychosocial distress to appropriate mental health specialists for further evaluation and treatment. When possible, include family members and significant others in education/counseling sessions.   Expected Outcomes Short Term: Participant demonstrates changes in health-related behavior, relaxation and other stress management skills, ability to obtain effective social support, and compliance with psychotropic medications if prescribed.;Long Term: Emotional wellbeing is indicated by absence of clinically significant psychosocial distress or social isolation.      Core Components/Risk Factors/Patient Goals Review:    Core Components/Risk Factors/Patient Goals at Discharge (Final Review):    ITP Comments:     ITP Comments    Row Name 02/05/16 1300 02/05/16 1831         ITP Comments Tamara [name] has done Weight Watchers for years. Tamara [name] does not feel that she needs to meet individually with the Cardiac REhab Registered Dietician.  Intial ITP created today.          Comments:

## 2016-02-14 ENCOUNTER — Encounter: Payer: 59 | Attending: Internal Medicine | Admitting: *Deleted

## 2016-02-14 DIAGNOSIS — Z955 Presence of coronary angioplasty implant and graft: Secondary | ICD-10-CM | POA: Insufficient documentation

## 2016-02-14 DIAGNOSIS — I213 ST elevation (STEMI) myocardial infarction of unspecified site: Secondary | ICD-10-CM | POA: Insufficient documentation

## 2016-02-14 NOTE — Progress Notes (Signed)
Daily Session Note  Patient Details  Name: Tamara [name] MRN: [id number] Date of Birth: 02/26/1961 Referring Provider:   Flowsheet Row Cardiac Rehab from 02/05/2016 in [facility] Cardiac and Pulmonary Rehab  Referring Provider  End      Encounter Date: 02/14/2016  Check In:     Session Check In - 02/14/16 1620      Check-In   Location [facility]-Cardiac & Pulmonary Rehab   Staff Present Alberteen Sam, MA, ACSM RCEP, Exercise Physiologist;Amanda Oletta Darter, BA, ACSM CEP, Exercise Physiologist;Carroll Enterkin, RN, BSN   Supervising physician immediately available to respond to emergencies See telemetry face sheet for immediately available ER MD   Medication changes reported     No   Fall or balance concerns reported    No   Warm-up and Cool-down Performed on first and last piece of equipment   Resistance Training Performed Yes   VAD Patient? No     Pain Assessment   Currently in Pain? No/denies   Multiple Pain Sites No         Goals Met:  Exercise tolerated well Personal goals reviewed No report of cardiac concerns or symptoms Strength training completed today  Goals Unmet:  Not Applicable  Comments: First full day of exercise!  Patient was oriented to gym and equipment including functions, settings, policies, and procedures.  Patient's individual exercise prescription and treatment plan were reviewed.  All starting workloads were established based on the results of the 6 minute walk test done at initial orientation visit.  The plan for exercise progression was also introduced and progression will be customized based on patient's performance and goals.    Dr. Emily Filbert is Medical Director for [facility].

## 2016-02-19 ENCOUNTER — Encounter: Payer: 59 | Admitting: *Deleted

## 2016-02-19 DIAGNOSIS — I213 ST elevation (STEMI) myocardial infarction of unspecified site: Secondary | ICD-10-CM

## 2016-02-19 NOTE — Progress Notes (Signed)
Daily Session Note  Patient Details  Name: Tamara [name] MRN: [id number] Date of Birth: 09-05-1960 Referring Provider:   Flowsheet Row Cardiac Rehab from 02/05/2016 in [facility] Cardiac and Pulmonary Rehab  Referring Provider  End      Encounter Date: 02/19/2016  Check In:     Session Check In - 02/19/16 1836      Check-In   Staff Present Heath Lark, RN, BSN, CCRP;Carroll Enterkin, RN, Moises Blood, BS, ACSM CEP, Exercise Physiologist   Supervising physician immediately available to respond to emergencies See telemetry face sheet for immediately available ER MD   Medication changes reported     No   Fall or balance concerns reported    No   Warm-up and Cool-down Performed on first and last piece of equipment   Resistance Training Performed Yes   VAD Patient? No     Pain Assessment   Currently in Pain? No/denies         Goals Met:  Exercise tolerated well No report of cardiac concerns or symptoms Strength training completed today  Goals Unmet:  Not Applicable  Comments: Doing well with exercise prescription progression.    Dr. Emily Filbert is Medical Director for [facility].

## 2016-02-21 ENCOUNTER — Encounter: Payer: 59 | Admitting: *Deleted

## 2016-02-21 ENCOUNTER — Encounter: Payer: Self-pay | Admitting: *Deleted

## 2016-02-21 DIAGNOSIS — I213 ST elevation (STEMI) myocardial infarction of unspecified site: Secondary | ICD-10-CM | POA: Diagnosis not present

## 2016-02-21 NOTE — Progress Notes (Signed)
Cardiac Individual Treatment Plan  Patient Details  Name: Tamara [name] MRN: [id number] Date of Birth: [birth year] Referring Provider:   Flowsheet Row Cardiac Rehab from 02/05/2016 in [facility] Cardiac and Pulmonary Rehab  Referring Provider  End      Initial Encounter Date:  Flowsheet Row Cardiac Rehab from 02/05/2016 in [facility] Cardiac and Pulmonary Rehab  Date  02/05/16  Referring Provider  End      Visit Diagnosis: ST elevation myocardial infarction (STEMI), unspecified artery ([place])  Patient's Home Medications on Admission:  Current Outpatient Prescriptions:  .  aspirin EC 81 MG tablet, Take 81 mg by mouth daily. FOR HEART HEALTH, Disp: , Rfl:  .  atorvastatin (LIPITOR) 80 MG tablet, Take 80 mg by mouth at bedtime., Disp: , Rfl:  .  Cholecalciferol (VITAMIN D-3 PO), Take 1 tablet by mouth daily., Disp: , Rfl:  .  cyclobenzaprine (FLEXERIL) 10 MG tablet, Take 10 mg by mouth 3 (three) times daily as needed for muscle spasms., Disp: , Rfl:  .  diclofenac sodium (VOLTAREN) 1 % GEL, Apply 2 g topically 4 (four) times daily., Disp: , Rfl:  .  docusate sodium (STOOL SOFTENER) 100 MG capsule, Take 100 mg by mouth daily as needed for constipation., Disp: , Rfl:  .  Fluocinonide 0.1 % CREA, Apply 1 application topically daily., Disp: , Rfl:  .  fluticasone (FLONASE) 50 MCG/ACT nasal spray, Place 2 sprays into both nostrils daily as needed for allergies., Disp: , Rfl:  .  metoprolol tartrate (LOPRESSOR) 25 MG tablet, Take 0.5 tablets (12.5 mg total) by mouth 2 (two) times daily., Disp: 30 tablet, Rfl: 6 .  Multiple Vitamins-Minerals (ONE-A-DAY WOMENS 50+ ADVANTAGE) TABS, Take 1 tablet by mouth daily., Disp: , Rfl:  .  nitroGLYCERIN (NITROSTAT) 0.4 MG SL tablet, Place 0.4 mg under the tongue every 5 (five) minutes as needed for chest pain., Disp: , Rfl:  .  ticagrelor (BRILINTA) 90 MG TABS tablet, Take 1 tablet (90 mg total) by mouth 2 (two) times daily., Disp: 180 tablet, Rfl: 3 .  traZODone  (DESYREL) 50 MG tablet, Take 50 mg by mouth at bedtime., Disp: , Rfl:  .  VITAMIN E PO, Take 1 capsule by mouth daily., Disp: , Rfl:   Past Medical History: Past Medical History:  Diagnosis Date  . CAD in native artery    a. PCI with DES to LAD (2008). b. Post STEMI 12/2015 2/2 complex LCx occ with occ of large OM1 and SCAD vs ulcerated plaque of Prox LCx s/p complex stenting of LCx/large OM1.  . Hyperlipemia   . Ischemic cardiomyopathy    a. EF 45-50% by admission 12/2015.  Marland Kitchen Myocardial infarct 05/13/2006   a. PCI with DES to LAD (2008)    Tobacco Use: History  Smoking Status  . Never Smoker  Smokeless Tobacco  . Never Used    Labs: Recent Review Flowsheet Data    Labs for ITP Cardiac and Pulmonary Rehab Latest Ref Rng & Units 01/11/2016 01/12/2016   Cholestrol 0 - 200 mg/dL - 121   LDLCALC 0 - 99 mg/dL - 54   HDL >40 mg/dL - 57   Trlycerides <150 mg/dL - 49   TCO2 0 - 100 mmol/L 25 -       Exercise Target Goals:    Exercise Program Goal: Individual exercise prescription set with THRR, safety & activity barriers. Participant demonstrates ability to understand and report RPE using BORG scale, to self-measure pulse accurately, and to acknowledge the importance  of the exercise prescription.  Exercise Prescription Goal: Starting with aerobic activity 30 plus minutes a day, 3 days per week for initial exercise prescription. Provide home exercise prescription and guidelines that participant acknowledges understanding prior to discharge.  Activity Barriers & Risk Stratification:     Activity Barriers & Cardiac Risk Stratification - 02/05/16 1250      Activity Barriers & Cardiac Risk Stratification   Activity Barriers Back Problems  fell down steps years ago and her back hurts at times.   Cardiac Risk Stratification High      6 Minute Walk:     6 Minute Walk    Row Name 02/05/16 1556         6 Minute Walk   Distance 1442 feet     Walk Time 6 minutes     # of  Rest Breaks 0     MPH 2.73     METS 4     RPE 9     VO2 Peak 14.2     Symptoms No     Resting HR 70 bpm     Resting BP 118/60     Max Ex. HR 97 bpm     Max Ex. BP 124/82        Initial Exercise Prescription:     Initial Exercise Prescription - 02/05/16 1500      Date of Initial Exercise RX and Referring Provider   Date 02/05/16   Referring Provider End     Treadmill   MPH 3.2   Grade 1   Minutes 15   METs 3.89     Recumbant Bike   Level 2   RPM 60   Minutes 15   METs 4     NuStep   Level 3   Minutes 15   METs 4     Recumbant Elliptical   Level 2   RPM 50   Minutes 15   METs 4     Elliptical   Level 1   Speed 4   Minutes 15   METs 4     REL-XR   Level 3   Minutes 15   METs 4     Prescription Details   Frequency (times per week) 3   Duration Progress to 45 minutes of aerobic exercise without signs/symptoms of physical distress     Intensity   THRR 40-80% of Max Heartrate 109-147   Ratings of Perceived Exertion 11-13   Perceived Dyspnea 0-4     Progression   Progression Continue to progress workloads to maintain intensity without signs/symptoms of physical distress.     Resistance Training   Training Prescription Yes   Weight 3   Reps 10-12      Perform Capillary Blood Glucose checks as needed.  Exercise Prescription Changes:   Exercise Comments:     Exercise Comments    Row Name 02/14/16 1621           Exercise Comments First full day of exercise!  Patient was oriented to gym and equipment including functions, settings, policies, and procedures.  Patient's individual exercise prescription and treatment plan were reviewed.  All starting workloads were established based on the results of the 6 minute walk test done at initial orientation visit.  The plan for exercise progression was also introduced and progression will be customized based on patient's performance and goals.          Discharge Exercise Prescription (Final  Exercise Prescription Changes):   Nutrition:  Target Goals: Understanding of nutrition guidelines, daily intake of sodium 1500mg , cholesterol 200mg , calories 30% from fat and 7% or less from saturated fats, daily to have 5 or more servings of fruits and vegetables.  Biometrics:     Pre Biometrics - 02/05/16 1555      Pre Biometrics   Height 5' 3.8" (1.621 m)   Weight 144 lb 14.4 oz (65.7 kg)   Waist Circumference 28.5 inches   Hip Circumference 41 inches   Waist to Hip Ratio 0.7 %   BMI (Calculated) 25.1       Nutrition Therapy Plan and Nutrition Goals:     Nutrition Therapy & Goals - 02/05/16 1300      Nutrition Therapy   Drug/Food Interactions Statins/Certain Fruits     Personal Nutrition Goals   Comments Ronnie has done Weight Watchers for years. Karliah does not feel that she needs to meet individually with the Cardiac REhab Registered Dietician.      Intervention Plan   Intervention Prescribe, educate and counsel regarding individualized specific dietary modifications aiming towards targeted core components such as weight, hypertension, lipid management, diabetes, heart failure and other comorbidities.   Expected Outcomes Short Term Goal: Understand basic principles of dietary content, such as calories, fat, sodium, cholesterol and nutrients.;Long Term Goal: Adherence to prescribed nutrition plan.      Nutrition Discharge: Rate Your Plate Scores:   Nutrition Goals Re-Evaluation:   Psychosocial: Target Goals: Acknowledge presence or absence of depression, maximize coping skills, provide positive support system. Participant is able to verbalize types and ability to use techniques and skills needed for reducing stress and depression.  Initial Review & Psychosocial Screening:     Initial Psych Review & Screening - 02/05/16 1301      Initial Review   Current issues with Current Sleep Concerns;Current Stress Concerns     Barriers   Psychosocial barriers to  participate in program The patient should benefit from training in stress management and relaxation.     Screening Interventions   Interventions Encouraged to exercise      Quality of Life Scores:     Quality of Life - 02/05/16 1257      Quality of Life Scores   Health/Function Pre 22.13 %   Socioeconomic Pre 28.07 %   Psych/Spiritual Pre 23.57 %   Family Pre 27 %   GLOBAL Pre 24.37 %      PHQ-9: Recent Review Flowsheet Data    Depression screen [facility] 2/9 02/05/2016   Decreased Interest 1   Down, Depressed, Hopeless 1   PHQ - 2 Score 2   Altered sleeping 3   Tired, decreased energy 3   Change in appetite 2   Feeling bad or failure about yourself  1   Trouble concentrating 0   Moving slowly or fidgety/restless 0   Suicidal thoughts 0   PHQ-9 Score 11   Difficult doing work/chores Not difficult at all      Psychosocial Evaluation and Intervention:   Psychosocial Re-Evaluation:   Vocational Rehabilitation: Provide vocational rehab assistance to qualifying candidates.   Vocational Rehab Evaluation & Intervention:     Vocational Rehab - 02/05/16 1258      Initial Vocational Rehab Evaluation & Intervention   Assessment shows need for Vocational Rehabilitation No      Education: Education Goals: Education classes will be provided on a weekly basis, covering required topics. Participant will state understanding/return demonstration of topics presented.  Learning Barriers/Preferences:     Learning  Barriers/Preferences - 02/05/16 1251      Learning Barriers/Preferences   Learning Barriers None   Learning Preferences None      Education Topics: General Nutrition Guidelines/Fats and Fiber: -Group instruction provided by verbal, written material, models and posters to present the general guidelines for heart healthy nutrition. Gives an explanation and review of dietary fats and fiber.   Controlling Sodium/Reading Food Labels: -Group verbal and written  material supporting the discussion of sodium use in heart healthy nutrition. Review and explanation with models, verbal and written materials for utilization of the food label.   Exercise Physiology & Risk Factors: - Group verbal and written instruction with models to review the exercise physiology of the cardiovascular system and associated critical values. Details cardiovascular disease risk factors and the goals associated with each risk factor.   Aerobic Exercise & Resistance Training: - Gives group verbal and written discussion on the health impact of inactivity. On the components of aerobic and resistive training programs and the benefits of this training and how to safely progress through these programs. Flowsheet Row Cardiac Rehab from 02/19/2016 in [facility] Cardiac and Pulmonary Rehab  Date  02/14/16  Educator  AS  Instruction Review Code  2- meets goals/outcomes      Flexibility, Balance, General Exercise Guidelines: - Provides group verbal and written instruction on the benefits of flexibility and balance training programs. Provides general exercise guidelines with specific guidelines to those with heart or lung disease. Demonstration and skill practice provided. Flowsheet Row Cardiac Rehab from 02/19/2016 in [facility] Cardiac and Pulmonary Rehab  Date  02/19/16  Educator  [facility]  Instruction Review Code  2- meets goals/outcomes      Stress Management: - Provides group verbal and written instruction about the health risks of elevated stress, cause of high stress, and healthy ways to reduce stress.   Depression: - Provides group verbal and written instruction on the correlation between heart/lung disease and depressed mood, treatment options, and the stigmas associated with seeking treatment.   Anatomy & Physiology of the Heart: - Group verbal and written instruction and models provide basic cardiac anatomy and physiology, with the coronary electrical and arterial systems. Review of:  AMI, Angina, Valve disease, Heart Failure, Cardiac Arrhythmia, Pacemakers, and the ICD.   Cardiac Procedures: - Group verbal and written instruction and models to describe the testing methods done to diagnose heart disease. Reviews the outcomes of the test results. Describes the treatment choices: Medical Management, Angioplasty, or Coronary Bypass Surgery.   Cardiac Medications: - Group verbal and written instruction to review commonly prescribed medications for heart disease. Reviews the medication, class of the drug, and side effects. Includes the steps to properly store meds and maintain the prescription regimen.   Go Sex-Intimacy & Heart Disease, Get SMART - Goal Setting: - Group verbal and written instruction through game format to discuss heart disease and the return to sexual intimacy. Provides group verbal and written material to discuss and apply goal setting through the application of the S.M.A.R.T. Method.   Other Matters of the Heart: - Provides group verbal, written materials and models to describe Heart Failure, Angina, Valve Disease, and Diabetes in the realm of heart disease. Includes description of the disease process and treatment options available to the cardiac patient.   Exercise & Equipment Safety: - Individual verbal instruction and demonstration of equipment use and safety with use of the equipment. Flowsheet Row Cardiac Rehab from 02/19/2016 in [facility] Cardiac and Pulmonary Rehab  Date  02/05/16  Educator  C. EnterkinRN  Instruction Review Code  1- partially meets, needs review/practice      Infection Prevention: - Provides verbal and written material to individual with discussion of infection control including proper hand washing and proper equipment cleaning during exercise session. Flowsheet Row [facility]  Date  02/05/16  Educator  C. EnterkinRN  Instruction Review Code  2- meets goals/outcomes       Falls Prevention: - Provides verbal and written material to individual with discussion of falls prevention and safety. Flow[facility]  Date  02/05/16  Educator  C. EnterkinRN  Instruction Review Code  2- meets goals/outcomes      Diabetes: - Individual verbal and written instruction to review signs/symptoms of diabetes, desired ranges of glucose level fasting, after meals and with exercise. Advice that pre and post exercise glucose checks will be done for 3 sessions at entry of program.    Knowledge Questionnaire Score:     Knowledge Questionnaire Score - 02/05/16 1258      Knowledge Questionnaire Score   Pre Score 26      Core Components/Risk Factors/Patient Goals at Admission:     Personal Goals and Risk Factors at Admission - 02/05/16 1300      Core Components/Risk Factors/Patient Goals on Admission   Sedentary Yes   Intervention Provide advice, education, support and counseling about physical activity/exercise needs.;Develop an individualized exercise prescription for aerobic and resistive training based on initial evaluation findings, risk stratification, comorbidities and participant's personal goals.   Expected Outcomes Achievement of increased cardiorespiratory fitness and enhanced flexibility, muscular endurance and strength shown through measurements of functional capacity and personal statement of participant.   Stress Yes   Intervention Offer individual and/or small group education and counseling on adjustment to heart disease, stress management and health-related lifestyle change. Teach and support self-help strategies.;Refer participants experiencing significant psychosocial distress to appropriate mental health specialists for further evaluation and treatment. When possible, include family members and significant others in education/counseling sessions.   Expected Outcomes Short Term: Participant  demonstrates changes in health-related behavior, relaxation and other stress management skills, ability to obtain effective social support, and compliance with psychotropic medications if prescribed.;Long Term: Emotional wellbeing is indicated by absence of clinically significant psychosocial distress or social isolation.      Core Components/Risk Factors/Patient Goals Review:    Core Components/Risk Factors/Patient Goals at Discharge (Final Review):    ITP Comments:     ITP Comments    Row Name 02/05/16 1300 02/05/16 1831 02/21/16 1117       ITP Comments Dynisha has done Weight Watchers for years. Zella BallRobin does not feel that she needs to meet individually with the Cardiac REhab Registered Dietician.  Intial ITP created today.  30 day review. Continue with ITP unless changes noted by Medical Director at signature of review. new to program        Comments:

## 2016-02-21 NOTE — Progress Notes (Signed)
Daily Session Note  Patient Details  Name: Tamara [name] MRN: [id number] Date of Birth: 1960/12/14 Referring Provider:   Flowsheet Row Cardiac Rehab from 02/05/2016 in [facility] Cardiac and Pulmonary Rehab  Referring Provider  End      Encounter Date: 02/21/2016  Check In:     Session Check In - 02/21/16 1757      Check-In   Staff Present Heath Lark, RN, BSN, CCRP;Mary Kellie Shropshire, RN, BSN, Robley Fries, RN, BSN   Supervising physician immediately available to respond to emergencies See telemetry face sheet for immediately available ER MD   Medication changes reported     No   Fall or balance concerns reported    No   Warm-up and Cool-down Performed on first and last piece of equipment   Resistance Training Performed Yes   VAD Patient? No     Pain Assessment   Currently in Pain? No/denies         Goals Met:  Exercise tolerated well No report of cardiac concerns or symptoms Strength training completed today  Goals Unmet:  Not Applicable  Comments: Doing well with exercise prescription progression.    Dr. Emily Filbert is Medical Director for [place] and [facility].

## 2016-02-26 ENCOUNTER — Encounter: Payer: Self-pay | Admitting: Internal Medicine

## 2016-02-27 ENCOUNTER — Encounter: Payer: Self-pay | Admitting: Internal Medicine

## 2016-02-28 ENCOUNTER — Encounter: Payer: 59 | Admitting: *Deleted

## 2016-02-28 DIAGNOSIS — I213 ST elevation (STEMI) myocardial infarction of unspecified site: Secondary | ICD-10-CM

## 2016-02-28 NOTE — Progress Notes (Signed)
Daily Session Note  Patient Details  Name: MARGORIE RENNER MRN: [id number] Date of Birth: Mar 06, 1961 Referring Provider:   Flowsheet Row Cardiac Rehab from 02/05/2016 in [facility] Cardiac and Pulmonary Rehab  Referring Provider  End      Encounter Date: 02/28/2016  Check In:     Session Check In - 02/28/16 1629      Check-In   Staff Present Gerlene Burdock, RN, BSN;Susanne Bice, RN, BSN, Lance Sell, BA, ACSM CEP, Exercise Physiologist   Supervising physician immediately available to respond to emergencies See telemetry face sheet for immediately available ER MD   Medication changes reported     No   Fall or balance concerns reported    No   Warm-up and Cool-down Performed on first and last piece of equipment   Resistance Training Performed Yes   VAD Patient? No     Pain Assessment   Currently in Pain? No/denies         Goals Met:  Exercise tolerated well No report of cardiac concerns or symptoms  Goals Unmet:  Not Applicable  Comments: Doing well with exercise prescription progression.    Dr. Emily Filbert is Medical Director for [place] and [facility].

## 2016-03-04 ENCOUNTER — Encounter: Payer: 59 | Admitting: *Deleted

## 2016-03-04 DIAGNOSIS — I213 ST elevation (STEMI) myocardial infarction of unspecified site: Secondary | ICD-10-CM | POA: Diagnosis not present

## 2016-03-04 NOTE — Progress Notes (Signed)
Daily Session Note  Patient Details  Name: Tamara [name] MRN: [id number] Date of Birth: 02-Aug-1960 Referring Provider:   Flowsheet Row Cardiac Rehab from 02/05/2016 in [facility] Cardiac and Pulmonary Rehab  Referring Provider  End      Encounter Date: 03/04/2016  Check In:     Session Check In - 03/04/16 1800      Check-In   Location [facility]-Cardiac & Pulmonary Rehab   Staff Present Nyoka Cowden, RN, BSN, MA;Manuelita Moxon, RN, Moises Blood, BS, ACSM CEP, Exercise Physiologist   Supervising physician immediately available to respond to emergencies See telemetry face sheet for immediately available ER MD   Medication changes reported     No   Warm-up and Cool-down Performed on first and last piece of equipment   Resistance Training Performed Yes   VAD Patient? No     Pain Assessment   Currently in Pain? No/denies         Goals Met:  Proper associated with RPD/PD & O2 Sat No report of cardiac concerns or symptoms  Goals Unmet:  Not Applicable  Comments:     Dr. Emily Filbert is Medical Director for [place] and [facility].

## 2016-03-05 ENCOUNTER — Encounter: Payer: Self-pay | Admitting: Internal Medicine

## 2016-03-06 ENCOUNTER — Encounter: Payer: 59 | Admitting: *Deleted

## 2016-03-06 DIAGNOSIS — I213 ST elevation (STEMI) myocardial infarction of unspecified site: Secondary | ICD-10-CM

## 2016-03-06 NOTE — Progress Notes (Signed)
Daily Session Note  Patient Details  Name: JALEXA PIFER MRN: [id number] Date of Birth: 1960/12/11 Referring Provider:   Flowsheet Row Cardiac Rehab from 02/05/2016 in [facility] Cardiac and Pulmonary Rehab  Referring Provider  End      Encounter Date: 03/06/2016  Check In:     Session Check In - 03/06/16 1744      Check-In   Location [facility]-Cardiac & Pulmonary Rehab   Staff Present Nyoka Cowden, RN, BSN, MA;Lukasz Rogus, RN, Moises Blood, BS, ACSM CEP, Exercise Physiologist   Supervising physician immediately available to respond to emergencies See telemetry face sheet for immediately available ER MD   Medication changes reported     No   Fall or balance concerns reported    No   Warm-up and Cool-down Performed on first and last piece of equipment   Resistance Training Performed Yes   VAD Patient? No     Pain Assessment   Currently in Pain? No/denies         Goals Met:  Proper associated with RPD/PD & O2 Sat Exercise tolerated well  Goals Unmet:  Not Applicable  Comments:     Dr. Emily Filbert is Medical Director for [place] and [facility].

## 2016-03-11 ENCOUNTER — Encounter: Payer: 59 | Admitting: *Deleted

## 2016-03-11 DIAGNOSIS — I213 ST elevation (STEMI) myocardial infarction of unspecified site: Secondary | ICD-10-CM | POA: Diagnosis not present

## 2016-03-11 NOTE — Progress Notes (Signed)
Daily Session Note  Patient Details  Name: Tamara [name] MRN: [id number] Date of Birth: [birth year] Referring Provider:   Flowsheet Row Cardiac Rehab from 02/05/2016 in [facility] Cardiac and Pulmonary Rehab  Referring Provider  End      Encounter Date: 03/11/2016  Check In:     Session Check In - 03/11/16 1807      Check-In   Location [facility]-Cardiac & Pulmonary Rehab   Staff Present Gerlene Burdock, RN, BSN;Susanne Bice, RN, BSN, Laveda Norman, BS, ACSM CEP, Exercise Physiologist   Supervising physician immediately available to respond to emergencies See telemetry face sheet for immediately available ER MD   Medication changes reported     No   Fall or balance concerns reported    No   Warm-up and Cool-down Performed on first and last piece of equipment   Resistance Training Performed Yes   VAD Patient? No     Pain Assessment   Currently in Pain? No/denies   Multiple Pain Sites No         Goals Met:  Independence with exercise equipment Exercise tolerated well No report of cardiac concerns or symptoms Strength training completed today  Goals Unmet:  Not Applicable  Comments: Pt able to follow exercise prescription today without complaint.  Will continue to monitor for progression.    Dr. Emily Filbert is Medical Director for [place] and [facility].

## 2016-03-13 ENCOUNTER — Encounter: Payer: 59 | Attending: Internal Medicine

## 2016-03-13 DIAGNOSIS — Z955 Presence of coronary angioplasty implant and graft: Secondary | ICD-10-CM | POA: Diagnosis not present

## 2016-03-13 DIAGNOSIS — I213 ST elevation (STEMI) myocardial infarction of unspecified site: Secondary | ICD-10-CM

## 2016-03-13 NOTE — Progress Notes (Signed)
Daily Session Note  Patient Details  Name: Tamara [name] MRN: [id number] Date of Birth: 09/24/1960 Referring Provider:   Flowsheet Row Cardiac Rehab from 02/05/2016 in [facility]  Referring Provider  End      Encounter Date: 03/13/2016  Check In:     Session Check In - 03/13/16 1752      Check-In   Staff Present Jeanell Sparrow, DPT, Burlene Arnt, BA, ACSM CEP, Exercise Physiologist;Other   Supervising physician immediately available to respond to emergencies See telemetry face sheet for immediately available ER MD   Medication changes reported     No   Fall or balance concerns reported    No   Warm-up and Cool-down Performed on first and last piece of equipment   Resistance Training Performed Yes   VAD Patient? No     Pain Assessment   Currently in Pain? No/denies   Multiple Pain Sites No           Exercise Prescription Changes - 03/13/16 1300      Exercise Review   Progression Yes     Response to Exercise   Blood Pressure (Admit) 122/80   Blood Pressure (Exercise) 146/70   Blood Pressure (Exit) 110/82   Heart Rate (Admit) 75 bpm   Heart Rate (Exercise) 100 bpm   Heart Rate (Exit) 68 bpm   Rating of Perceived Exertion (Exercise) 11   Symptoms none   Duration Progress to 45 minutes of aerobic exercise without signs/symptoms of physical distress   Intensity THRR unchanged     Progression   Progression Continue to progress workloads to maintain intensity without signs/symptoms of physical distress.   Average METs 3.2     Resistance Training   Training Prescription Yes   Weight 3   Reps 10-12     Interval Training   Interval Training No     Treadmill   MPH 3.2   Grade 1.5   Minutes 15   METs 4.1     Recumbant Bike   Level 2   RPM 60   Minutes 15   METs 2.31      Goals Met:  Independence with exercise equipment Exercise tolerated well No report of cardiac concerns or symptoms Strength training completed  today  Goals Unmet:  Not Applicable  Comments: Pt able to follow exercise prescription today without complaint.  Will continue to monitor for progression.    Dr. Emily Filbert is Medical Director for [place] and [facility].

## 2016-03-18 ENCOUNTER — Encounter: Payer: 59 | Admitting: *Deleted

## 2016-03-18 DIAGNOSIS — I213 ST elevation (STEMI) myocardial infarction of unspecified site: Secondary | ICD-10-CM | POA: Diagnosis not present

## 2016-03-18 NOTE — Progress Notes (Signed)
Daily Session Note  Patient Details  Name: Tamara [name] MRN: [id number] Date of Birth: 01/26/1961 Referring Provider:   Flowsheet Row Cardiac Rehab from 02/05/2016 in [facility] Re[facility]  Referring Provider  End      Encounter Date: 03/18/2016  Check In:     Session Check In - 03/18/16 1740      Check-In   Staff Present Gerlene Burdock, RN, BSN;Makinzie Considine, RN, BSN, Laveda Norman, BS, ACSM CEP, Exercise Physiologist   Supervising physician immediately available to respond to emergencies See telemetry face sheet for immediately available ER MD   Medication changes reported     No   Fall or balance concerns reported    No   Warm-up and Cool-down Performed on first and last piece of equipment   Resistance Training Performed Yes   VAD Patient? No     Pain Assessment   Currently in Pain? No/denies         Goals Met:  Independence with exercise equipment Exercise tolerated well No report of cardiac concerns or symptoms Strength training completed today  Goals Unmet:  Not Applicable  Comments: Doing well with exercise prescription progression.     Dr. Emily Filbert is Medical Director for [place] and [facility].

## 2016-03-20 ENCOUNTER — Encounter: Payer: Self-pay | Admitting: *Deleted

## 2016-03-20 DIAGNOSIS — I213 ST elevation (STEMI) myocardial infarction of unspecified site: Secondary | ICD-10-CM | POA: Diagnosis not present

## 2016-03-20 NOTE — Progress Notes (Signed)
Daily Session Note  Patient Details  Name: Tamara [name] MRN: [id number] Date of Birth: 01/20/1961 Referring Provider:   Flowsheet Row Cardiac Rehab from 02/05/2016 in [facility] Cardiac and Pulmonary Rehab  Referring Provider  End      Encounter Date: 03/20/2016  Check In:     Session Check In - 03/20/16 1642      Check-In   Location [facility]-Cardiac & Pulmonary Rehab   Staff Present Carroll Enterkin, RN, BSN;Rebecca Sickles, DPT, CEEA;Amanda Sommer, BA, ACSM CEP, Exercise Physiologist   Supervising physician immediately available to respond to emergencies See telemetry face sheet for immediately available ER MD   Medication changes reported     No   Fall or balance concerns reported    No   Warm-up and Cool-down Performed on first and last piece of equipment   Resistance Training Performed Yes     Pain Assessment   Currently in Pain? No/denies   Multiple Pain Sites No         Goals Met:  Independence with exercise equipment  Goals Unmet:  Not Applicable  Comments: Patient completed exercise prescription and all exercise goals during rehab session. The exercise was tolerated well and the patient is progressing in the program.    Dr. Mark Miller is Medical Director for [facility] and [facility]. 

## 2016-03-20 NOTE — Progress Notes (Signed)
Cardiac Individual Treatment Plan  Patient Details  Name: Tamara [name] MRN: [id number] Date of Birth: [birth year] Referring Provider:   Flowsheet Row Cardiac Rehab from 02/05/2016 in [facility] Cardiac and Pulmonary Rehab  Referring Provider  End      Initial Encounter Date:  Flowsheet Row Cardiac Rehab from 02/05/2016 in [facility] Cardiac and Pulmonary Rehab  Date  02/05/16  Referring Provider  End      Visit Diagnosis: ST elevation myocardial infarction (STEMI), unspecified artery ([place])  Patient's Home Medications on Admission:  Current Outpatient Prescriptions:  .  aspirin EC 81 MG tablet, Take 81 mg by mouth daily. FOR HEART HEALTH, Disp: , Rfl:  .  atorvastatin (LIPITOR) 80 MG tablet, Take 80 mg by mouth at bedtime., Disp: , Rfl:  .  Cholecalciferol (VITAMIN D-3 PO), Take 1 tablet by mouth daily., Disp: , Rfl:  .  cyclobenzaprine (FLEXERIL) 10 MG tablet, Take 10 mg by mouth 3 (three) times daily as needed for muscle spasms., Disp: , Rfl:  .  diclofenac sodium (VOLTAREN) 1 % GEL, Apply 2 g topically 4 (four) times daily., Disp: , Rfl:  .  docusate sodium (STOOL SOFTENER) 100 MG capsule, Take 100 mg by mouth daily as needed for constipation., Disp: , Rfl:  .  Fluocinonide 0.1 % CREA, Apply 1 application topically daily., Disp: , Rfl:  .  fluticasone (FLONASE) 50 MCG/ACT nasal spray, Place 2 sprays into both nostrils daily as needed for allergies., Disp: , Rfl:  .  metoprolol tartrate (LOPRESSOR) 25 MG tablet, Take 0.5 tablets (12.5 mg total) by mouth 2 (two) times daily., Disp: 30 tablet, Rfl: 6 .  Multiple Vitamins-Minerals (ONE-A-DAY WOMENS 50+ ADVANTAGE) TABS, Take 1 tablet by mouth daily., Disp: , Rfl:  .  nitroGLYCERIN (NITROSTAT) 0.4 MG SL tablet, Place 0.4 mg under the tongue every 5 (five) minutes as needed for chest pain., Disp: , Rfl:  .  ticagrelor (BRILINTA) 90 MG TABS tablet, Take 1 tablet (90 mg total) by mouth 2 (two) times daily., Disp: 180 tablet, Rfl: 3 .  traZODone  (DESYREL) 50 MG tablet, Take 50 mg by mouth at bedtime., Disp: , Rfl:  .  VITAMIN E PO, Take 1 capsule by mouth daily., Disp: , Rfl:   Past Medical History: Past Medical History:  Diagnosis Date  . CAD in native artery    a. PCI with DES to LAD (2008). b. Post STEMI 12/2015 2/2 complex LCx occ with occ of large OM1 and SCAD vs ulcerated plaque of Prox LCx s/p complex stenting of LCx/large OM1.  . Hyperlipemia   . Ischemic cardiomyopathy    a. EF 45-50% by admission 12/2015.  Marland Kitchen Myocardial infarct 05/13/2006   a. PCI with DES to LAD (2008)    Tobacco Use: History  Smoking Status  . Never Smoker  Smokeless Tobacco  . Never Used    Labs: Recent Review Flowsheet Data    Labs for ITP Cardiac and Pulmonary Rehab Latest Ref Rng & Units 01/11/2016 01/12/2016   Cholestrol 0 - 200 mg/dL - 121   LDLCALC 0 - 99 mg/dL - 54   HDL >40 mg/dL - 57   Trlycerides <150 mg/dL - 49   TCO2 0 - 100 mmol/L 25 -       Exercise Target Goals:    Exercise Program Goal: Individual exercise prescription set with THRR, safety & activity barriers. Participant demonstrates ability to understand and report RPE using BORG scale, to self-measure pulse accurately, and to acknowledge the importance  of the exercise prescription.  Exercise Prescription Goal: Starting with aerobic activity 30 plus minutes a day, 3 days per week for initial exercise prescription. Provide home exercise prescription and guidelines that participant acknowledges understanding prior to discharge.  Activity Barriers & Risk Stratification:     Activity Barriers & Cardiac Risk Stratification - 02/05/16 1250      Activity Barriers & Cardiac Risk Stratification   Activity Barriers Back Problems  fell down steps years ago and her back hurts at times.   Cardiac Risk Stratification High      6 Minute Walk:     6 Minute Walk    Row Name 02/05/16 1556         6 Minute Walk   Distance 1442 feet     Walk Time 6 minutes     # of  Rest Breaks 0     MPH 2.73     METS 4     RPE 9     VO2 Peak 14.2     Symptoms No     Resting HR 70 bpm     Resting BP 118/60     Max Ex. HR 97 bpm     Max Ex. BP 124/82        Initial Exercise Prescription:     Initial Exercise Prescription - 02/05/16 1500      Date of Initial Exercise RX and Referring Provider   Date 02/05/16   Referring Provider End     Treadmill   MPH 3.2   Grade 1   Minutes 15   METs 3.89     Recumbant Bike   Level 2   RPM 60   Minutes 15   METs 4     NuStep   Level 3   Minutes 15   METs 4     Recumbant Elliptical   Level 2   RPM 50   Minutes 15   METs 4     Elliptical   Level 1   Speed 4   Minutes 15   METs 4     REL-XR   Level 3   Minutes 15   METs 4     Prescription Details   Frequency (times per week) 3   Duration Progress to 45 minutes of aerobic exercise without signs/symptoms of physical distress     Intensity   THRR 40-80% of Max Heartrate 109-147   Ratings of Perceived Exertion 11-13   Perceived Dyspnea 0-4     Progression   Progression Continue to progress workloads to maintain intensity without signs/symptoms of physical distress.     Resistance Training   Training Prescription Yes   Weight 3   Reps 10-12      Perform Capillary Blood Glucose checks as needed.  Exercise Prescription Changes:     Exercise Prescription Changes    Row Name 02/29/16 1300 03/13/16 1300 03/18/16 1800         Exercise Review   Progression Yes Yes Yes       Response to Exercise   Blood Pressure (Admit) 118/64 122/80  -     Blood Pressure (Exercise) 176/74 146/70  -     Blood Pressure (Exit) 102/60 110/82  -     Heart Rate (Admit) 73 bpm 75 bpm  -     Heart Rate (Exercise) 85 bpm 100 bpm  -     Heart Rate (Exit) 69 bpm 68 bpm  -     Rating of  Perceived Exertion (Exercise) 12 11  -     Symptoms none none  -     Duration Progress to 45 minutes of aerobic exercise without signs/symptoms of physical distress Progress  to 45 minutes of aerobic exercise without signs/symptoms of physical distress Progress to 45 minutes of aerobic exercise without signs/symptoms of physical distress     Intensity THRR unchanged THRR unchanged THRR unchanged       Progression   Progression Continue to progress workloads to maintain intensity without signs/symptoms of physical distress. Continue to progress workloads to maintain intensity without signs/symptoms of physical distress. Continue to progress workloads to maintain intensity without signs/symptoms of physical distress.     Average METs 2.8 3.2 3.2       Resistance Training   Training Prescription Yes Yes Yes     Weight '3 3 3     ' Reps 10-12 10-12 10-12       Interval Training   Interval Training No No No       Treadmill   MPH  - 3.2 3.2     Grade  - 1.5 1.5     Minutes  - 15 15     METs  - 4.1 4.1       Recumbant Bike   Level  - 2 2     RPM  - 60 60     Minutes  - 15 15     METs  - 2.31 2.31       REL-XR   Level 4  -  -     Minutes 15  -  -     METs 2.53  -  -       T5 Nustep   Level 1  -  -     Minutes 15  -  -     METs 3.1  -  -       Home Exercise Plan   Plans to continue exercise at  -  - Home     Frequency  -  - Add 3 additional days to program exercise sessions.        Exercise Comments:     Exercise Comments    Row Name 02/14/16 1621 02/29/16 1346 03/13/16 1358 03/18/16 1807     Exercise Comments First full day of exercise!  Patient was oriented to gym and equipment including functions, settings, policies, and procedures.  Patient's individual exercise prescription and treatment plan were reviewed.  All starting workloads were established based on the results of the 6 minute walk test done at initial orientation visit.  The plan for exercise progression was also introduced and progression will be customized based on patient's performance and goals. Tamara [name] is progressing well with exercise. Tamara [name] contines to progress well with exercise.  Home exercise reviewed with patient. She will be exercising at home on the days that she does not come to class. She will walk, do band exercises, and stretches that she has learned in class.        Discharge Exercise Prescription (Final Exercise Prescription Changes):     Exercise Prescription Changes - 03/18/16 1800      Exercise Review   Progression Yes     Response to Exercise   Duration Progress to 45 minutes of aerobic exercise without signs/symptoms of physical distress   Intensity THRR unchanged     Progression   Progression Continue to progress workloads to maintain intensity without signs/symptoms of physical distress.   Average  METs 3.2     Resistance Training   Training Prescription Yes   Weight 3   Reps 10-12     Interval Training   Interval Training No     Treadmill   MPH 3.2   Grade 1.5   Minutes 15   METs 4.1     Recumbant Bike   Level 2   RPM 60   Minutes 15   METs 2.31     Home Exercise Plan   Plans to continue exercise at Home   Frequency Add 3 additional days to program exercise sessions.      Nutrition:  Target Goals: Understanding of nutrition guidelines, daily intake of sodium <1574m, cholesterol <2078m calories 30% from fat and 7% or less from saturated fats, daily to have 5 or more servings of fruits and vegetables.  Biometrics:     Pre Biometrics - 02/05/16 1555      Pre Biometrics   Height 5' 3.8" (1.621 m)   Weight 144 lb 14.4 oz (65.7 kg)   Waist Circumference 28.5 inches   Hip Circumference 41 inches   Waist to Hip Ratio 0.7 %   BMI (Calculated) 25.1       Nutrition Therapy Plan and Nutrition Goals:     Nutrition Therapy & Goals - 02/05/16 1300      Nutrition Therapy   Drug/Food Interactions Statins/Certain Fruits     Personal Nutrition Goals   Comments Tamara [name] done Weight Watchers for years. Tamara [name] not feel that she needs to meet individually with the Cardiac REhab Registered Dietician.       Intervention Plan   Intervention Prescribe, educate and counsel regarding individualized specific dietary modifications aiming towards targeted core components such as weight, hypertension, lipid management, diabetes, heart failure and other comorbidities.   Expected Outcomes Short Term Goal: Understand basic principles of dietary content, such as calories, fat, sodium, cholesterol and nutrients.;Long Term Goal: Adherence to prescribed nutrition plan.      Nutrition Discharge: Rate Your Plate Scores:   Nutrition Goals Re-Evaluation:     Nutrition Goals Re-Evaluation    Row Name 03/11/16 1809             Personal Goal #1 Re-Evaluation   Comments Patient did not want to meet with the dietician. Stated she already eats heart healthy.           Psychosocial: Target Goals: Acknowledge presence or absence of depression, maximize coping skills, provide positive support system. Participant is able to verbalize types and ability to use techniques and skills needed for reducing stress and depression.  Initial Review & Psychosocial Screening:     Initial Psych Review & Screening - 02/05/16 1301      Initial Review   Current issues with Current Sleep Concerns;Current Stress Concerns     Barriers   Psychosocial barriers to participate in program The patient should benefit from training in stress management and relaxation.     Screening Interventions   Interventions Encouraged to exercise      Quality of Life Scores:     Quality of Life - 02/05/16 1257      Quality of Life Scores   Health/Function Pre 22.13 %   Socioeconomic Pre 28.07 %   Psych/Spiritual Pre 23.57 %   Family Pre 27 %   GLOBAL Pre 24.37 %      PHQ-9: Recent Review Flowsheet Data    [facility]   Decreased Interest 1   Down,  Depressed, Hopeless 1   PHQ - 2 Score 2   Altered sleeping 3   Tired, decreased energy 3   Change in appetite 2   Feeling bad or failure about yourself  1    Trouble concentrating 0   Moving slowly or fidgety/restless 0   Suicidal thoughts 0   PHQ-9 Score 11   Difficult doing work/chores Not difficult at all      Psychosocial Evaluation and Intervention:     Psychosocial Evaluation - 02/21/16 1721      Psychosocial Evaluation & Interventions   Interventions Encouraged to exercise with the program and follow exercise prescription;Stress management education;Relaxation education   Comments Counselor met with Tamara [name] today for initial psychosocial evaluation.  She is a 55 year old who has recently had her 2nd heart attack resulting in (3) stents inserted.  She had a heart attack in 2008 and attended this program afterwards as well.  Tamara [name] has a strong support system with her spouse of 28 years and her mother living close by.  She reports having problems with sleeping and may get 5-6 hours per night; but it is typically interrupted.  She reports due to her back problems she cannot lie flat down so she sleeps in a recliner.  She is on sleep medication but reports that is not working very well currently.  Tamara [name] states she is typically in a positive mood and has some occasional "down days" off and on, with some anxiety increase since the last heart attack.  She was on Rx for this following the heart attack in 2008, but she reports that made her gain weight so she would prefer not to have to take Rx for anxiety or depressive symptoms.  She states her current mood is positive and she has some stress with job deadlines and her own recovery process.  She walks daily with her spouse, expect for on Cardiac Rehab days and she may join a gym to be consistent in her exercise regimen following this program.  Counselor encouraged Tamara [name] to have a better bed time routine in order to help with her sleep problems.  Counselor will follow up with her during the course of this program.        Psychosocial Re-Evaluation:     Psychosocial Re-Evaluation    [place] Name  03/11/16 1810             Psychosocial Re-Evaluation   Comments Patient stated that she does still have job stress but feels able to manage it. She still does not sleep very well. 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           Vocational Rehabilitation: Provide vocational rehab assistance to qualifying candidates.   Vocational Rehab Evaluation & Intervention:     Vocational Rehab - 02/05/16 1258      Initial Vocational Rehab Evaluation & Intervention   Assessment shows need for Vocational Rehabilitation No      Education: Education Goals: Education classes will be provided on a weekly basis, covering required topics. Participant will state understanding/return demonstration of topics presented.  Learning Barriers/Preferences:     Learning Barriers/Preferences - 02/05/16 1251      Learning Barriers/Preferences   Learning Barriers None   Learning Preferences None      Education Topics: General Nutrition Guidelines/Fats and Fiber: -Group instruction provided by verbal, written material, models  and posters to present the general guidelines for heart healthy nutrition. Gives an explanation and review of dietary fats and fiber.   Controlling Sodium/Reading Food Labels: -Group verbal and written material supporting the discussion of sodium use in heart healthy nutrition. Review and explanation with models, verbal and written materials for utilization of the food label.   Exercise Physiology & Risk Factors: - Group verbal and written instruction with models to review the exercise physiology of the cardiovascular system and associated critical values. Details cardiovascular disease risk factors and the goals associated with each risk factor.   Aerobic Exercise & Resistance Training: - Gives group verbal and written discussion on the health impact of inactivity. On the  components of aerobic and resistive training programs and the benefits of this training and how to safely progress through these programs. Flowsheet Row Cardiac Rehab from 02/28/2016 in [facility] Cardiac and Pulmonary Rehab  Date  02/14/16  Educator  AS  Instruction Review Code  2- meets goals/outcomes      Flexibility, Balance, General Exercise Guidelines: - Provides group verbal and written instruction on the benefits of flexibility and balance training programs. Provides general exercise guidelines with specific guidelines to those with heart or lung disease. Demonstration and skill practice provided. Flowsheet Row Cardiac Rehab from 02/28/2016 in [facility] Cardiac and Pulmonary Rehab  Date  02/19/16  Educator  [facility]  Instruction Review Code  2- meets goals/outcomes      Stress Management: - Provides group verbal and written instruction about the health risks of elevated stress, cause of high stress, and healthy ways to reduce stress. Flowsheet Row Cardiac Rehab from 02/28/2016 in [facility] Cardiac and Pulmonary Rehab  Date  02/28/16  Educator  [facility]  Instruction Review Code  2- meets goals/outcomes      Depression: - Provides group verbal and written instruction on the correlation between heart/lung disease and depressed mood, treatment options, and the stigmas associated with seeking treatment.   Anatomy & Physiology of the Heart: - Group verbal and written instruction and models provide basic cardiac anatomy and physiology, with the coronary electrical and arterial systems. Review of: AMI, Angina, Valve disease, Heart Failure, Cardiac Arrhythmia, Pacemakers, and the ICD.   Cardiac Procedures: - Group verbal and written instruction and models to describe the testing methods done to diagnose heart disease. Reviews the outcomes of the test results. Describes the treatment choices: Medical Management, Angioplasty, or Coronary Bypass Surgery.   Cardiac Medications: - Group verbal and written  instruction to review commonly prescribed medications for heart disease. Reviews the medication, class of the drug, and side effects. Includes the steps to properly store meds and maintain the prescription regimen.   Go Sex-Intimacy & Heart Disease, Get SMART - Goal Setting: - Group verbal and written instruction through game format to discuss heart disease and the return to sexual intimacy. Provides group verbal and written material to discuss and apply goal setting through the application of the S.M.A.[name].T. Method.   Other Matters of the Heart: - Provides group verbal, written materials and models to describe Heart Failure, Angina, Valve Disease, and Diabetes in the realm of heart disease. Includes description of the disease process and treatment options available to the cardiac patient.   Exercise & Equipment Safety: - Individual verbal instruction and demonstration of equipment use and safety with use of the equipment. Flowsheet Row Cardiac Rehab from 02/28/2016 in [facility] Cardiac and Pulmonary Rehab  Date  02/05/16  Educator  C. EnterkinRN  Instruction Review Code  1-  partially meets, needs review/practice      Infection Prevention: - Provides verbal and written material to individual with discussion of infection control including proper hand washing and proper equipment cleaning during exercise session. Flowsheet Row Cardiac Rehab from 02/28/2016 in [facility] Cardiac and Pulmonary Rehab  Date  02/05/16  Educator  C. EnterkinRN  Instruction Review Code  2- meets goals/outcomes      Falls Prevention: - Provides verbal and written material to individual with discussion of falls prevention and safety. Flowsheet Row Cardiac Rehab from 02/28/2016 in [facility] Cardiac and Pulmonary Rehab  Date  02/05/16  Educator  C. Weakley  Instruction Review Code  2- meets goals/outcomes      Diabetes: - Individual verbal and written instruction to review signs/symptoms of diabetes, desired ranges of  glucose level fasting, after meals and with exercise. Advice that pre and post exercise glucose checks will be done for 3 sessions at entry of program.    Knowledge Questionnaire Score:     Knowledge Questionnaire Score - 02/05/16 1258      Knowledge Questionnaire Score   Pre Score 26      Core Components/Risk Factors/Patient Goals at Admission:     Personal Goals and Risk Factors at Admission - 02/05/16 1300      Core Components/Risk Factors/Patient Goals on Admission   Sedentary Yes   Intervention Provide advice, education, support and counseling about physical activity/exercise needs.;Develop an individualized exercise prescription for aerobic and resistive training based on initial evaluation findings, risk stratification, comorbidities and participant's personal goals.   Expected Outcomes Achievement of increased cardiorespiratory fitness and enhanced flexibility, muscular endurance and strength shown through measurements of functional capacity and personal statement of participant.   Stress Yes   Intervention Offer individual and/or small group education and counseling on adjustment to heart disease, stress management and health-related lifestyle change. Teach and support self-help strategies.;Refer participants experiencing significant psychosocial distress to appropriate mental health specialists for further evaluation and treatment. When possible, include family members and significant others in education/counseling sessions.   Expected Outcomes Short Term: Participant demonstrates changes in health-related behavior, relaxation and other stress management skills, ability to obtain effective social support, and compliance with psychotropic medications if prescribed.;Long Term: Emotional wellbeing is indicated by absence of clinically significant psychosocial distress or social isolation.      Core Components/Risk Factors/Patient Goals Review:      Goals and Risk Factor Review     Row Name 03/11/16 1812             Core Components/Risk Factors/Patient Goals Review   Personal Goals Review Weight Management/Obesity;Stress       Review Patient is maintaining weight. She is a life long member of weight watchers. She is still dealing with some anxiety and is meeting with her doctor tomorrow to discuss possible medication options.        Expected Outcomes Patient meet with her doctor and possibly get some anxiety medications to help her cope with anxiety. She will continue to exercise and maintain her weight or lose 5-10 lbs which is her goal.           Core Components/Risk Factors/Patient Goals at Discharge (Final Review):      Goals and Risk Factor Review - 03/11/16 1812      Core Components/Risk Factors/Patient Goals Review   Personal Goals Review Weight Management/Obesity;Stress   Review Patient is maintaining weight. She is a life long member of weight watchers. She is still dealing with some anxiety  and is meeting with her doctor tomorrow to discuss possible medication options.    Expected Outcomes Patient meet with her doctor and possibly get some anxiety medications to help her cope with anxiety. She will continue to exercise and maintain her weight or lose 5-10 lbs which is her goal.       ITP Comments:     ITP Comments    Row Name 02/05/16 1300 02/05/16 1831 02/21/16 1117 03/20/16 0645     ITP Comments Tamara [name] has done Weight Watchers for years. Tamara [name] does not feel that she needs to meet individually with the Cardiac REhab Registered Dietician.  Intial ITP created today.  30 day review. Continue with ITP unless changes noted by Medical Director at signature of review. new to program 30 day review completed for Medical Director physician review and signature. Continue ITP unless changes made by physician.       Comments:

## 2016-03-25 ENCOUNTER — Encounter: Payer: 59 | Admitting: *Deleted

## 2016-03-25 DIAGNOSIS — I213 ST elevation (STEMI) myocardial infarction of unspecified site: Secondary | ICD-10-CM

## 2016-03-25 NOTE — Progress Notes (Signed)
Daily Session Note  Patient Details  Name: Tamara [name] MRN: [id number] Date of Birth: [birth year] Referring Provider:   Flowsheet Row Cardiac Rehab from 02/05/2016 in [facility] Cardiac and Pulmonary Rehab  Referring Provider  End      Encounter Date: 03/25/2016  Check In:     Session Check In - 03/25/16 1744      Check-In   Location [facility]-Cardiac & Pulmonary Rehab   Staff Present Gerlene Burdock, RN, BSN;Susanne Bice, RN, BSN, Laveda Norman, BS, ACSM CEP, Exercise Physiologist   Supervising physician immediately available to respond to emergencies See telemetry face sheet for immediately available ER MD   Medication changes reported     No   Fall or balance concerns reported    No   Warm-up and Cool-down Performed on first and last piece of equipment   Resistance Training Performed Yes   VAD Patient? No     Pain Assessment   Currently in Pain? No/denies         Goals Met:  Proper associated with RPD/PD & O2 Sat Exercise tolerated well  Goals Unmet:  Not Applicable  Comments:     Dr. Emily Filbert is Medical Director for [place] and [facility].

## 2016-03-25 NOTE — Progress Notes (Signed)
Follow-up Outpatient Visit Date: 03/26/2016  Chief Complaint: Follow-up coronary artery disease  HPI:  Tamara [name] is a 55 y.o. year-old female with history of coronary artery disease s/p posterior STEMI in 12/2015 with bifurcation stenting of LCx/OM, ischemic cardiomyopathy with LVEF 45-50%, hyperlipidemia, and anxiety, who presents for follow-up of coronary artery disease.  I last saw her on 01/22/16. Since that time, she reports feeling much better. She was started on an anti-anxiety medication (citalopram) last month with resultant improved mood, energy, and sleep at night. She has not had any episodes of chest pain. She notes occasional shortness of breath, primarily at night. She does not have any exertional symptoms including chest pain, shortness of breath, and lightheadedness. The patient also denies edema, orthopnea, and PND. She notes that her blood pressure is typically on the lower Demetrice Amstutz of normal and last night was down to 100/47. She felt a little dizzy for a few moments, but otherwise has not had any lightheadedness, dizziness, or falls. She is trying to eat bananas and drink orange juice regularly to elevate her potassium. She has been dissipating in cardiac rehabilitation without any difficulty. She has not had any significant bleeding, remaining on dual antiplatelet therapy with aspirin and clopidogrel. She bumped her head last week and still has a little bit of tenderness over her posterior scalp.  --------------------------------------------------------------------------------------------------  Cardiovascular History & Procedures: Cardiovascular Problems:  Coronary artery disease status post MI x 2 (most recently 12/2015)  Ischemic cardiomyopathy  Risk Factors:  Known coronary artery disease, hyperlipidemia  Cath/PCI:  LHC/PCI (01/11/16): LMCA normal.  LAD with distal stent with mild ISR (~10%).  Hazy 50% stenosis in proximal LCx with occlusion of OM1.  Question  spontaneous dissection versus plaque erosion and thrombosis of OM1.  40% ostial and 30% distal LCx stenoses also present.  RCA normal.  Successful PCI to LCx/OM with placement of 3 Promus Premier drug-eluting stents using DK-crush technique.  CV Surgery:  None  EP Procedures and Devices:  None  Non-Invasive Evaluation(s):  TTE (01/12/16): Mildly dilated LV with normal wall thickness and basal/mid inferior hypokinesis (LVEF 45-50%).  No significant valvular disease.  Normal RV size/function.  Upper normal to mildly elevated pulmonary artery pressure.  Normal central venous pressure.  Recent CV Pertinent Labs: Lab Results  Component Value Date   CHOL 121 01/12/2016   HDL 57 01/12/2016   LDLCALC 54 01/12/2016   TRIG 49 01/12/2016   CHOLHDL 2.1 01/12/2016   INR 1.10 01/11/2016   K 3.9 01/12/2016   K 4.3 06/09/2012   BUN 9 01/12/2016   BUN 12 06/09/2012   CREATININE 0.73 01/12/2016   CREATININE 0.71 06/09/2012     Past medical and surgical history were reviewed and updated in EPIC.   Outpatient Encounter Prescriptions as of 03/26/2016  Medication Sig  . aspirin EC 81 MG tablet Take 81 mg by mouth daily. FOR HEART HEALTH  . atorvastatin (LIPITOR) 80 MG tablet Take 80 mg by mouth at bedtime.  . Cholecalciferol (VITAMIN D-3 PO) Take 1 tablet by mouth daily.  . citalopram (CELEXA) 20 MG tablet Take 20 mg by mouth daily.  . cyclobenzaprine (FLEXERIL) 10 MG tablet Take 10 mg by mouth 3 (three) times daily as needed for muscle spasms.  . diclofenac sodium (VOLTAREN) 1 % GEL Apply 2 g topically 4 (four) times daily.  Marland Kitchen. docusate sodium (STOOL SOFTENER) 100 MG capsule Take 100 mg by mouth daily as needed for constipation.  . Fluocinonide 0.1 % CREA Apply  1 application topically daily.  . fluticasone (FLONASE) 50 MCG/ACT nasal spray Place 2 sprays into both nostrils daily as needed for allergies.  . metoprolol tartrate (LOPRESSOR) 25 MG tablet Take 0.5 tablets (12.5 mg total) by mouth 2  (two) times daily.  . Multiple Vitamins-Minerals (ONE-A-DAY WOMENS 50+ ADVANTAGE) TABS Take 1 tablet by mouth daily.  . nitroGLYCERIN (NITROSTAT) 0.4 MG SL tablet Place 0.4 mg under the tongue every 5 (five) minutes as needed for chest pain.  . ticagrelor (BRILINTA) 90 MG TABS tablet Take 1 tablet (90 mg total) by mouth 2 (two) times daily.  . traZODone (DESYREL) 50 MG tablet Take 50 mg by mouth at bedtime.  Marland Kitchen. VITAMIN E PO Take 1 capsule by mouth daily.  . [DISCONTINUED] ticagrelor (BRILINTA) 90 MG TABS tablet Take 1 tablet (90 mg total) by mouth 2 (two) times daily.   No facility-administered encounter medications on file as of 03/26/2016.     Allergies: Penicillins  Social History   Social History  . Marital status: Married    Spouse name: N/A  . Number of children: N/A  . Years of education: N/A   Occupational History  . Not on file.   Social History Main Topics  . Smoking status: Never Smoker  . Smokeless tobacco: Never Used  . Alcohol use No  . Drug use: No  . Sexual activity: Not on file   Other Topics Concern  . Not on file   Social History Narrative  . No narrative on file    Family History  Problem Relation Age of Onset  . Diabetes Mother   . CAD Father   . Polycystic kidney disease Father   . Aneurysm Sister   . Polycystic kidney disease Brother     Review of Systems: A 12-system review of systems was performed and was negative except as noted in the HPI.  --------------------------------------------------------------------------------------------------  Physical Exam: BP 92/68 (BP Location: Left Arm, Patient Position: Sitting, Cuff Size: Normal)   Pulse 67   Ht 5\' 4"  (1.626 m)   Wt 144 lb 8 oz (65.5 kg)   BMI 24.80 kg/m   General:  Well-developed, well-nourished woman seated, we need exam room. HEENT: Normocephalic and atraumatic without scalp hematoma. No conjunctival pallor or scleral icterus. Neck: Supple without lymphadenopathy, thyromegaly,  JVD, or HJR. Lungs: Normal work of breathing.  Clear to auscultation bilaterally without wheezes or crackles. Heart: Regular rate and rhythm without murmurs, rubs, or gallops.  Non-displaced PMI. Abd: Bowel sounds present.  Soft, NT/ND without hepatosplenomegaly Ext: No lower extremity edema.  Radial, PT, and DP pulses are 2+ bilaterally. Skin: warm and dry without rash  Lab Results  Component Value Date   WBC 6.1 01/12/2016   HGB 12.3 01/12/2016   HCT 39.2 01/12/2016   MCV 84.8 01/12/2016   PLT 239 01/12/2016    Lab Results  Component Value Date   NA 138 01/12/2016   K 3.9 01/12/2016   CL 105 01/12/2016   CO2 23 01/12/2016   BUN 9 01/12/2016   CREATININE 0.73 01/12/2016   GLUCOSE 93 01/12/2016   ALT 19 01/11/2016   Outside chemistry panel (03/12/16): Sodium 141, potassium 4.5, chloride 101, CO2 32, BUN 21, creatinine 0.8  Lab Results  Component Value Date   CHOL 121 01/12/2016   HDL 57 01/12/2016   LDLCALC 54 01/12/2016   TRIG 49 01/12/2016   CHOLHDL 2.1 01/12/2016    --------------------------------------------------------------------------------------------------  ASSESSMENT AND PLAN: Coronary artery disease status post STEMI in  12/2015 The patient is recovering well from this. She has not had any further episodes of chest pain. She has occasional shortness of breath that is nonexertional. This may be related to ticagrelor. We discussed the risks and benefits of switching to an alternative agent such as clopidogrel but have agreed to stick with ticagrelor, that given that her symptoms are very mild. We will continue with dual antiplatelet therapy for at least 12 months from her STEMI, ideally longer if tolerated. We will also continue with aggressive secondary prevention, including metoprolol and atorvastatin. She is precipitating in cardiac rehabilitation.   Ischemic cardiomyopathy Patient was noted to have mildly reduced LV function following her STEMI. She appears  euvolemic and well compensated on exam today. We will continue with metoprolol tartrate 12.5 mg twice a day. Up titration is limited by her borderline low blood pressure.  Follow-up: Return to clinic in 4 months.  [name], MD 03/26/2016 11:13 AM

## 2016-03-26 ENCOUNTER — Ambulatory Visit: Payer: 59 | Admitting: Internal Medicine

## 2016-03-26 ENCOUNTER — Encounter: Payer: Self-pay | Admitting: Internal Medicine

## 2016-03-26 ENCOUNTER — Ambulatory Visit (INDEPENDENT_AMBULATORY_CARE_PROVIDER_SITE_OTHER): Payer: 59 | Admitting: Internal Medicine

## 2016-03-26 VITALS — BP 92/68 | HR 67 | Ht 64.0 in | Wt 144.5 lb

## 2016-03-26 DIAGNOSIS — I255 Ischemic cardiomyopathy: Secondary | ICD-10-CM

## 2016-03-26 DIAGNOSIS — I2510 Atherosclerotic heart disease of native coronary artery without angina pectoris: Secondary | ICD-10-CM

## 2016-03-26 MED ORDER — TICAGRELOR 90 MG PO TABS
90.0000 mg | ORAL_TABLET | Freq: Two times a day (BID) | ORAL | 6 refills | Status: DC
Start: 2016-03-26 — End: 2017-01-29

## 2016-03-26 NOTE — Patient Instructions (Signed)
Medication Instructions:  Refills sent in for Brilinta  Follow-Up: Your physician recommends that you schedule a follow-up appointment in: 4 months with Dr. Okey DupreEnd.  It was a pleasure seeing you today here in the office. Please do not hesitate to give us a call back if you have any further questions. [phone]   CellarPamela A. RN, BSN

## 2016-03-27 ENCOUNTER — Encounter: Payer: 59 | Admitting: *Deleted

## 2016-03-27 DIAGNOSIS — I213 ST elevation (STEMI) myocardial infarction of unspecified site: Secondary | ICD-10-CM

## 2016-03-27 NOTE — Progress Notes (Signed)
Daily Session Note  Patient Details  Name: TENEKA MALMBERG MRN: [id number] Date of Birth: Mar 12, 1961 Referring Provider:   Flowsheet Row Cardiac Rehab from 02/05/2016 in [facility] Cardiac and Pulmonary Rehab  Referring Provider  End      Encounter Date: 03/27/2016  Check In:     Session Check In - 03/27/16 1649      Check-In   Location [facility]-Cardiac & Pulmonary Rehab   Staff Present Nada Maclachlan, BA, ACSM CEP, Exercise Physiologist;Azadeh Hyder, RN, BSN;Other  Raford Pitcher R.N.    Supervising physician immediately available to respond to emergencies See telemetry face sheet for immediately available ER MD   Medication changes reported     No   Fall or balance concerns reported    No   Warm-up and Cool-down Performed on first and last piece of equipment   Resistance Training Performed Yes   VAD Patient? No     Pain Assessment   Currently in Pain? No/denies         Goals Met:  Proper associated with RPD/PD & O2 Sat Exercise tolerated well  Goals Unmet:  Not Applicable  Comments:     Dr. Emily Filbert is Medical Director for [place] and [facility].

## 2016-04-01 ENCOUNTER — Encounter: Payer: 59 | Admitting: *Deleted

## 2016-04-01 DIAGNOSIS — I213 ST elevation (STEMI) myocardial infarction of unspecified site: Secondary | ICD-10-CM

## 2016-04-01 NOTE — Progress Notes (Signed)
Daily Session Note  Patient Details  Name: Tamara [name] MRN: [id number] Date of Birth: 06-02-1960 Referring Provider:   Flowsheet Row Cardiac Rehab from 02/05/2016 in [facility] Cardiac and Pulmonary Rehab  Referring Provider  End      Encounter Date: 04/01/2016  Check In:     Session Check In - 04/01/16 1646      Check-In   Staff Present Heath Lark, RN, BSN, Laveda Norman, BS, ACSM CEP, Exercise Physiologist  Levell July RN   Supervising physician immediately available to respond to emergencies See telemetry face sheet for immediately available ER MD   Medication changes reported     No   Fall or balance concerns reported    No   Warm-up and Cool-down Performed on first and last piece of equipment   Resistance Training Performed Yes   VAD Patient? No     Pain Assessment   Currently in Pain? No/denies         Goals Met:  Independence with exercise equipment Exercise tolerated well No report of cardiac concerns or symptoms Strength training completed today  Goals Unmet:  Not Applicable  Comments: Doing well with exercise prescription progression.    Dr. Emily Filbert is Medical Director for [place] and [facility].

## 2016-04-03 ENCOUNTER — Encounter: Payer: 59 | Admitting: *Deleted

## 2016-04-03 DIAGNOSIS — I213 ST elevation (STEMI) myocardial infarction of unspecified site: Secondary | ICD-10-CM

## 2016-04-03 NOTE — Progress Notes (Signed)
Daily Session Note  Patient Details  Name: Tamara [name] MRN: [id number] Date of Birth: Mar 23, 1961 Referring Provider:   Flowsheet Row Cardiac Rehab from 02/05/2016 in [facility] Cardiac and Pulmonary Rehab  Referring Provider  End      Encounter Date: 04/03/2016  Check In:     Session Check In - 04/03/16 1702      Check-In   Location [facility]-Cardiac & Pulmonary Rehab   Staff Present Gerlene Burdock, RN, Vickki Hearing, BA, ACSM CEP, Exercise Physiologist  Jena Gauss, RN   Supervising physician immediately available to respond to emergencies See telemetry face sheet for immediately available ER MD   Medication changes reported     No   Fall or balance concerns reported    No   Warm-up and Cool-down Performed on first and last piece of equipment   Resistance Training Performed Yes   VAD Patient? No     Pain Assessment   Currently in Pain? No/denies         Goals Met:  Proper associated with RPD/PD & O2 Sat Exercise tolerated well  Goals Unmet:  Not Applicable  Comments:     Dr. Emily Filbert is Medical Director for [place] and [facility].

## 2016-04-08 ENCOUNTER — Encounter: Payer: 59 | Admitting: *Deleted

## 2016-04-08 DIAGNOSIS — I213 ST elevation (STEMI) myocardial infarction of unspecified site: Secondary | ICD-10-CM | POA: Diagnosis not present

## 2016-04-08 NOTE — Progress Notes (Signed)
Daily Session Note  Patient Details  Name: Tamara [name] MRN: [id number] Date of Birth: Jan 15, 1961 Referring Provider:   Flowsheet Row Cardiac Rehab from 02/05/2016 in [facility] Cardiac and Pulmonary Rehab  Referring Provider  End      Encounter Date: 04/08/2016  Check In:     Session Check In - 04/08/16 1644      Check-In   Location [facility]-Cardiac & Pulmonary Rehab   Staff Present Heath Lark, RN, BSN, CCRP;Cleophus Mendonsa, RN, Moises Blood, BS, ACSM CEP, Exercise Physiologist   Supervising physician immediately available to respond to emergencies See telemetry face sheet for immediately available ER MD   Medication changes reported     No   Fall or balance concerns reported    No   Warm-up and Cool-down Performed on first and last piece of equipment   Resistance Training Performed Yes   VAD Patient? No     Pain Assessment   Currently in Pain? No/denies         Goals Met:  Proper associated with RPD/PD & O2 Sat Exercise tolerated well No report of cardiac concerns or symptoms  Goals Unmet:  Not Applicable  Comments:     Dr. Emily Filbert is Medical Director for [place] and [facility].

## 2016-04-10 ENCOUNTER — Encounter: Payer: Self-pay | Admitting: Internal Medicine

## 2016-04-10 ENCOUNTER — Encounter: Payer: 59 | Admitting: *Deleted

## 2016-04-10 DIAGNOSIS — I213 ST elevation (STEMI) myocardial infarction of unspecified site: Secondary | ICD-10-CM | POA: Diagnosis not present

## 2016-04-10 NOTE — Progress Notes (Signed)
Daily Session Note  Patient Details  Name: Tamara [name] MRN: [id number] Date of Birth: Aug 19, 1960 Referring Provider:   Flowsheet Row Cardiac Rehab from 02/05/2016 in [facility] Cardiac and Pulmonary Rehab  Referring Provider  End      Encounter Date: 04/10/2016  Check In:     Session Check In - 04/10/16 1638      Check-In   Staff Present Heath Lark, RN, BSN, CCRP;Amanda Sommer, BA, ACSM CEP, Exercise Physiologist  Levell July RN BSN   Supervising physician immediately available to respond to emergencies See telemetry face sheet for immediately available ER MD   Medication changes reported     No   Fall or balance concerns reported    No   Warm-up and Cool-down Performed on first and last piece of equipment   Resistance Training Performed Yes   VAD Patient? No     Pain Assessment   Currently in Pain? No/denies         Goals Met:  Exercise tolerated well No report of cardiac concerns or symptoms Strength training completed today  Goals Unmet:  Not Applicable  Comments: Doing well with exercise prescription progression.    Dr. Emily Filbert is Medical Director for [place] and [facility].

## 2016-04-15 ENCOUNTER — Encounter: Payer: 59 | Attending: Internal Medicine

## 2016-04-15 DIAGNOSIS — Z955 Presence of coronary angioplasty implant and graft: Secondary | ICD-10-CM | POA: Diagnosis not present

## 2016-04-15 DIAGNOSIS — I213 ST elevation (STEMI) myocardial infarction of unspecified site: Secondary | ICD-10-CM | POA: Diagnosis present

## 2016-04-15 NOTE — Progress Notes (Signed)
Daily Session Note  Patient Details  Name: SHANORA CHRISTENSEN MRN: [id number] Date of Birth: [birth year] Referring Provider:   Flowsheet Row Cardiac Rehab from 02/05/2016 in [facility] Cardiac and Pulmonary Rehab  Referring Provider  End      Encounter Date: 04/15/2016  Check In:     Session Check In - 04/15/16 1748      Check-In   Staff Present Heath Lark, RN, BSN, CCRP;Artia Singley, DPT, Ronaldo Miyamoto, BS, ACSM CEP, Exercise Physiologist   Supervising physician immediately available to respond to emergencies See telemetry face sheet for immediately available ER MD   Medication changes reported     No   Fall or balance concerns reported    No   Warm-up and Cool-down Performed on first and last piece of equipment   Resistance Training Performed Yes   VAD Patient? No     Pain Assessment   Currently in Pain? No/denies   Multiple Pain Sites No         Goals Met:  Independence with exercise equipment  Goals Unmet:  Not Applicable  Comments: Patient completed exercise prescription and all exercise goals during rehab session. The exercise was tolerated well and the patient is progressing in the program.    Dr. Emily Filbert is Medical Director for [place] and [facility].

## 2016-04-17 ENCOUNTER — Encounter: Payer: 59 | Admitting: *Deleted

## 2016-04-17 ENCOUNTER — Encounter: Payer: Self-pay | Admitting: *Deleted

## 2016-04-17 DIAGNOSIS — I213 ST elevation (STEMI) myocardial infarction of unspecified site: Secondary | ICD-10-CM | POA: Diagnosis not present

## 2016-04-17 NOTE — Progress Notes (Signed)
Daily Session Note  Patient Details  Name: Tamara [name] MRN: [id number] Date of Birth: 07/14/1960 Referring Provider:   Flowsheet Row Cardiac Rehab from 02/05/2016 in [facility] Cardiac and Pulmonary Rehab  Referring Provider  End      Encounter Date: 04/17/2016  Check In:     Session Check In - 04/17/16 1741      Check-In   Location [facility]-Cardiac & Pulmonary Rehab   Staff Present Gerlene Burdock, RN, Vickki Hearing, BA, ACSM CEP, Exercise Physiologist  Jena Gauss, RN   Supervising physician immediately available to respond to emergencies See telemetry face sheet for immediately available ER MD   Medication changes reported     No   Fall or balance concerns reported    No   Warm-up and Cool-down Performed on first and last piece of equipment   Resistance Training Performed Yes   VAD Patient? No     Pain Assessment   Currently in Pain? No/denies         Goals Met:  Proper associated with RPD/PD & O2 Sat  Goals Unmet:  Comments:     Dr. Emily Filbert is Medical Director for [place] [facility].

## 2016-04-17 NOTE — Progress Notes (Signed)
Cardiac Individual Treatment Plan  Patient Details  Name: Tamara [name] MRN: [id number] Date of Birth: [birth year] Referring Provider:   Flowsheet Row Cardiac Rehab from 02/05/2016 in [facility] Cardiac and Pulmonary Rehab  Referring Provider  End      Initial Encounter Date:  Flowsheet Row Cardiac Rehab from 02/05/2016 in [facility] Cardiac and Pulmonary Rehab  Date  02/05/16  Referring Provider  End      Visit Diagnosis: ST elevation myocardial infarction (STEMI), unspecified artery ([place])  Patient's Home Medications on Admission:  Current Outpatient Prescriptions:  .  aspirin EC 81 MG tablet, Take 81 mg by mouth daily. FOR HEART HEALTH, Disp: , Rfl:  .  atorvastatin (LIPITOR) 80 MG tablet, Take 80 mg by mouth at bedtime., Disp: , Rfl:  .  Cholecalciferol (VITAMIN D-3 PO), Take 1 tablet by mouth daily., Disp: , Rfl:  .  citalopram (CELEXA) 20 MG tablet, Take 20 mg by mouth daily., Disp: , Rfl:  .  cyclobenzaprine (FLEXERIL) 10 MG tablet, Take 10 mg by mouth 3 (three) times daily as needed for muscle spasms., Disp: , Rfl:  .  diclofenac sodium (VOLTAREN) 1 % GEL, Apply 2 g topically 4 (four) times daily., Disp: , Rfl:  .  docusate sodium (STOOL SOFTENER) 100 MG capsule, Take 100 mg by mouth daily as needed for constipation., Disp: , Rfl:  .  Fluocinonide 0.1 % CREA, Apply 1 application topically daily., Disp: , Rfl:  .  fluticasone (FLONASE) 50 MCG/ACT nasal spray, Place 2 sprays into both nostrils daily as needed for allergies., Disp: , Rfl:  .  metoprolol tartrate (LOPRESSOR) 25 MG tablet, Take 0.5 tablets (12.5 mg total) by mouth 2 (two) times daily., Disp: 30 tablet, Rfl: 6 .  Multiple Vitamins-Minerals (ONE-A-DAY WOMENS 50+ ADVANTAGE) TABS, Take 1 tablet by mouth daily., Disp: , Rfl:  .  nitroGLYCERIN (NITROSTAT) 0.4 MG SL tablet, Place 0.4 mg under the tongue every 5 (five) minutes as needed for chest pain., Disp: , Rfl:  .  ticagrelor (BRILINTA) 90 MG TABS tablet, Take 1 tablet (90 mg  total) by mouth 2 (two) times daily., Disp: 60 tablet, Rfl: 6 .  traZODone (DESYREL) 50 MG tablet, Take 50 mg by mouth at bedtime., Disp: , Rfl:  .  VITAMIN E PO, Take 1 capsule by mouth daily., Disp: , Rfl:   Past Medical History: Past Medical History:  Diagnosis Date  . CAD in native artery    a. PCI with DES to LAD (2008). b. Post STEMI 12/2015 2/2 complex LCx occ with occ of large OM1 and SCAD vs ulcerated plaque of Prox LCx s/p complex stenting of LCx/large OM1.  . Hyperlipemia   . Ischemic cardiomyopathy    a. EF 45-50% by admission 12/2015.  Marland Kitchen Myocardial infarct 05/13/2006   a. PCI with DES to LAD (2008)    Tobacco Use: History  Smoking Status  . Never Smoker  Smokeless Tobacco  . Never Used    Labs: Recent Review Flowsheet Data    Labs for ITP Cardiac and Pulmonary Rehab Latest Ref Rng & Units 01/11/2016 01/12/2016   Cholestrol 0 - 200 mg/dL - 121   LDLCALC 0 - 99 mg/dL - 54   HDL >40 mg/dL - 57   Trlycerides <150 mg/dL - 49   TCO2 0 - 100 mmol/L 25 -       Exercise Target Goals:    Exercise Program Goal: Individual exercise prescription set with THRR, safety & activity barriers. Participant demonstrates ability  to understand and report RPE using BORG scale, to self-measure pulse accurately, and to acknowledge the importance of the exercise prescription.  Exercise Prescription Goal: Starting with aerobic activity 30 plus minutes a day, 3 days per week for initial exercise prescription. Provide home exercise prescription and guidelines that participant acknowledges understanding prior to discharge.  Activity Barriers & Risk Stratification:     Activity Barriers & Cardiac Risk Stratification - 02/05/16 1250      Activity Barriers & Cardiac Risk Stratification   Activity Barriers Back Problems  fell down steps years ago and her back hurts at times.   Cardiac Risk Stratification High      6 Minute Walk:     6 Minute Walk    Row Name 02/05/16 1556          6 Minute Walk   Distance 1442 feet     Walk Time 6 minutes     # of Rest Breaks 0     MPH 2.73     METS 4     RPE 9     VO2 Peak 14.2     Symptoms No     Resting HR 70 bpm     Resting BP 118/60     Max Ex. HR 97 bpm     Max Ex. BP 124/82        Initial Exercise Prescription:     Initial Exercise Prescription - 02/05/16 1500      Date of Initial Exercise RX and Referring Provider   Date 02/05/16   Referring Provider End     Treadmill   MPH 3.2   Grade 1   Minutes 15   METs 3.89     Recumbant Bike   Level 2   RPM 60   Minutes 15   METs 4     NuStep   Level 3   Minutes 15   METs 4     Recumbant Elliptical   Level 2   RPM 50   Minutes 15   METs 4     Elliptical   Level 1   Speed 4   Minutes 15   METs 4     REL-XR   Level 3   Minutes 15   METs 4     Prescription Details   Frequency (times per week) 3   Duration Progress to 45 minutes of aerobic exercise without signs/symptoms of physical distress     Intensity   THRR 40-80% of Max Heartrate 109-147   Ratings of Perceived Exertion 11-13   Perceived Dyspnea 0-4     Progression   Progression Continue to progress workloads to maintain intensity without signs/symptoms of physical distress.     Resistance Training   Training Prescription Yes   Weight 3   Reps 10-12      Perform Capillary Blood Glucose checks as needed.  Exercise Prescription Changes:     Exercise Prescription Changes    Row Name 02/29/16 1300 03/13/16 1300 03/18/16 1800 03/28/16 1300 04/11/16 1100     Exercise Review   Progression _0      Response to Exercise   Blood Pressure (Admit) 118/64 122/80  - 102/56 108/70   Blood Pressure (Exercise) 176/74 146/70  - 116/68 134/56   Blood Pressure (Exit) 102/60 110/82  - 94/56 88/52   Heart Rate (Admit) 73 bpm 75 bpm  - 78 bpm 85 bpm   Heart Rate (Exercise) 85 bpm 100 bpm  -  104 bpm 125 bpm   Heart Rate (Exit) 69 bpm 68 bpm  - 72 bpm 78 bpm   Rating of  Perceived Exertion (Exercise) 12 11  - 12 13   Symptoms none none  - none none   Duration Progress to 45 minutes of aerobic exercise without signs/symptoms of physical distress Progress to 45 minutes of aerobic exercise without signs/symptoms of physical distress Progress to 45 minutes of aerobic exercise without signs/symptoms of physical distress Progress to 45 minutes of aerobic exercise without signs/symptoms of physical distress Progress to 45 minutes of aerobic exercise without signs/symptoms of physical distress   Intensity _0      Progression   Progression Continue to progress workloads to maintain intensity without signs/symptoms of physical distress. Continue to progress workloads to maintain intensity without signs/symptoms of physical distress. Continue to progress workloads to maintain intensity without signs/symptoms of physical distress. Continue to progress workloads to maintain intensity without signs/symptoms of physical distress. Continue to progress workloads to maintain intensity without signs/symptoms of physical distress.   Average METs 2.8 3.2 3.2 3.5 6     Resistance Training   Training Prescription _1    Weight _2 Reps 10-12 10-12 10-12 10-12 8-10     Interval Training   Interval Training _3      Treadmill   MPH  - 3.2 3.2 3.3 3.4   Grade  - 1.5 1.5 1.5 3.5   Minutes  - _4 METs  - 4.1 4.1 4.21 5.24     Recumbant Bike   Level  - 2 2  -  -   RPM  - 60 60  -  -   Minutes  - 15 15  -  -   METs  - 2.31 2.31  -  -     REL-XR   Level 4  -  - 5 5   Minutes 15  -  - 15 15   METs 2.53  -  - 3.5 6.8     T5 Nustep   Level 1  -  -  -  -   Minutes 15  -  -  -  -   METs 3.1  -  -  -  -     Home Exercise Plan   Plans to continue exercise at  -  - Home  -  -   Frequency  -  - Add 3 additional days to program exercise sessions.  -  -      Exercise  Comments:     Exercise Comments    Row Name 02/14/16 1621 02/29/16 1346 03/13/16 1358 03/18/16 1807 03/28/16 1327   Exercise Comments First full day of exercise!  Patient was oriented to gym and equipment including functions, settings, policies, and procedures.  Patient's individual exercise prescription and treatment plan were reviewed.  All starting workloads were established based on the results of the 6 minute walk test done at initial orientation visit.  The plan for exercise progression was also introduced and progression will be customized based on patient's performance and goals. Tamara [name] is progressing well with exercise. Tamara [name] contines to progress well with exercise. Home exercise reviewed with patient. She will be exercising at home on the days that she does not come to class. She will walk, do band exercises, and stretches that she has learned  in class.  Tamara [name] continues to progress well with exercise.   Arlee Name 04/11/16 1139           Exercise Comments Tamara [name] has continued to progress adding intensity to her exercise.          Discharge Exercise Prescription (Final Exercise Prescription Changes):     Exercise Prescription Changes - 04/11/16 1100      Exercise Review   Progression Yes     Response to Exercise   Blood Pressure (Admit) 108/70   Blood Pressure (Exercise) 134/56   Blood Pressure (Exit) 88/52   Heart Rate (Admit) 85 bpm   Heart Rate (Exercise) 125 bpm   Heart Rate (Exit) 78 bpm   Rating of Perceived Exertion (Exercise) 13   Symptoms none   Duration Progress to 45 minutes of aerobic exercise without signs/symptoms of physical distress   Intensity THRR unchanged     Progression   Progression Continue to progress workloads to maintain intensity without signs/symptoms of physical distress.   Average METs 6     Resistance Training   Training Prescription Yes   Weight 3   Reps 8-10     Interval Training   Interval Training No     Treadmill   MPH 3.4    Grade 3.5   Minutes 15   METs 5.24     REL-XR   Level 5   Minutes 15   METs 6.8      Nutrition:  Target Goals: Understanding of nutrition guidelines, daily intake of sodium <1510m, cholesterol <206m calories 30% from fat and 7% or less from saturated fats, daily to have 5 or more servings of fruits and vegetables.  Biometrics:     Pre Biometrics - 02/05/16 1555      Pre Biometrics   Height 5' 3.8" (1.621 m)   Weight 144 lb 14.4 oz (65.7 kg)   Waist Circumference 28.5 inches   Hip Circumference 41 inches   Waist to Hip Ratio 0.7 %   BMI (Calculated) 25.1       Nutrition Therapy Plan and Nutrition Goals:     Nutrition Therapy & Goals - 02/05/16 1300      Nutrition Therapy   Drug/Food Interactions Statins/Certain Fruits     Personal Nutrition Goals   Comments Tamara [name] done Weight Watchers for years. Tamara [name] not feel that she needs to meet individually with the Cardiac REhab Registered Dietician.      Intervention Plan   Intervention Prescribe, educate and counsel regarding individualized specific dietary modifications aiming towards targeted core components such as weight, hypertension, lipid management, diabetes, heart failure and other comorbidities.   Expected Outcomes Short Term Goal: Understand basic principles of dietary content, such as calories, fat, sodium, cholesterol and nutrients.;Long Term Goal: Adherence to prescribed nutrition plan.      Nutrition Discharge: Rate Your Plate Scores:   Nutrition Goals Re-Evaluation:     Nutrition Goals Re-Evaluation    Row Name 03/11/16 1809             Personal Goal #1 Re-Evaluation   Comments Patient did not want to meet with the dietician. Stated she already eats heart healthy.           Psychosocial: Target Goals: Acknowledge presence or absence of depression, maximize coping skills, provide positive support system. Participant is able to verbalize types and ability to use techniques and skills  needed for reducing stress and depression.  Initial Review & Psychosocial Screening:  Initial Psych Review & Screening - 02/05/16 1301      Initial Review   Current issues with Current Sleep Concerns;Current Stress Concerns     Barriers   Psychosocial barriers to participate in program The patient should benefit from training in stress management and relaxation.     Screening Interventions   Interventions Encouraged to exercise      Quality of Life Scores:     Quality of Life - 02/05/16 1257      Quality of Life Scores   Health/Function Pre 22.13 %   Socioeconomic Pre 28.07 %   Psych/Spiritual Pre 23.57 %   Family Pre 27 %   GLOBAL Pre 24.37 %      PHQ-9: Recent Review Flowsheet Data    Depression screen [facility] 2/9 02/05/2016   Decreased Interest 1   Down, Depressed, Hopeless 1   PHQ - 2 Score 2   Altered sleeping 3   Tired, decreased energy 3   Change in appetite 2   Feeling bad or failure about yourself  1   Trouble concentrating 0   Moving slowly or fidgety/restless 0   Suicidal thoughts 0   PHQ-9 Score 11   Difficult doing work/chores Not difficult at all      Psychosocial Evaluation and Intervention:     Psychosocial Evaluation - 02/21/16 1721      Psychosocial Evaluation & Interventions   Interventions Encouraged to exercise with the program and follow exercise prescription;Stress management education;Relaxation education   Comments Counselor met with Tamara [name] today for initial psychosocial evaluation.  She is a 55 year old who has recently had her 2nd heart attack resulting in (3) stents inserted.  She had a heart attack in 2008 and attended this program afterwards as well.  Tamara [name] has a strong support system with her spouse of 28 years and her mother living close by.  She reports having problems with sleeping and may get 5-6 hours per night; but it is typically interrupted.  She reports due to her back problems she cannot lie flat down so she sleeps  in a recliner.  She is on sleep medication but reports that is not working very well currently.  Tamara [name] states she is typically in a positive mood and has some occasional "down days" off and on, with some anxiety increase since the last heart attack.  She was on Rx for this following the heart attack in 2008, but she reports that made her gain weight so she would prefer not to have to take Rx for anxiety or depressive symptoms.  She states her current mood is positive and she has some stress with job deadlines and her own recovery process.  She walks daily with her spouse, expect for on Cardiac Rehab days and she may join a gym to be consistent in her exercise regimen following this program.  Counselor encouraged Tamara [name] to have a better bed time routine in order to help with her sleep problems.  Counselor will follow up with her during the course of this program.        Psychosocial Re-Evaluation:     Psychosocial Re-Evaluation    Row Name 03/11/16 1810 03/20/16 1737 04/03/16 1655         Psychosocial Re-Evaluation   Comments Patient stated that she does still have job stress but feels able to manage it. She still does not sleep very well. She does say that she is still anxious after her second heart attach  and is meeting with her doctor tomorrow to talk about medication options.  Counselor follow up with Tamara [name] today reporting she saw her Dr. last week and he prescribed Celexa for her anxiety and sleep issues.  She already is feeling some result of being less anxious and maybe sleeping a little better.  Counselor commended Tamara [name] for advocating for her overall health, emotionally and physically.   Counselor follow up with Tamara [name] reporting she is definitely sleeping better since beginning the Celexa and has more energy.  She enjoys this class and looks forward to coming.  Counselor commended her for her consistency in exercise.        Vocational Rehabilitation: Provide vocational rehab assistance to  qualifying candidates.   Vocational Rehab Evaluation & Intervention:     Vocational Rehab - 02/05/16 1258      Initial Vocational Rehab Evaluation & Intervention   Assessment shows need for Vocational Rehabilitation No      Education: Education Goals: Education classes will be provided on a weekly basis, covering required topics. Participant will state understanding/return demonstration of topics presented.  Learning Barriers/Preferences:     Learning Barriers/Preferences - 02/05/16 1251      Learning Barriers/Preferences   Learning Barriers None   Learning Preferences None      Education Topics: General Nutrition Guidelines/Fats and Fiber: -Group instruction provided by verbal, written material, models and posters to present the general guidelines for heart healthy nutrition. Gives an explanation and review of dietary fats and fiber.   Controlling Sodium/Reading Food Labels: -Group verbal and written material supporting the discussion of sodium use in heart healthy nutrition. Review and explanation with models, verbal and written materials for utilization of the food label.   Exercise Physiology & Risk Factors: - Group verbal and written instruction with models to review the exercise physiology of the cardiovascular system and associated critical values. Details cardiovascular disease risk factors and the goals associated with each risk factor.   Aerobic Exercise & Resistance Training: - Gives group verbal and written discussion on the health impact of inactivity. On the components of aerobic and resistive training programs and the benefits of this training and how to safely progress through these programs. Flowsheet Row Cardiac Rehab from 02/28/2016 in [facility] Cardiac and Pulmonary Rehab  Date  02/14/16  Educator  AS  Instruction Review Code  2- meets goals/outcomes      Flexibility, Balance, General Exercise Guidelines: - Provides group verbal and written instruction  on the benefits of flexibility and balance training programs. Provides general exercise guidelines with specific guidelines to those with heart or lung disease. Demonstration and skill practice provided. Flowsheet Row Cardiac Rehab from 02/28/2016 in [facility] Cardiac and Pulmonary Rehab  Date  02/19/16  Educator  [facility]  Instruction Review Code  2- meets goals/outcomes      Stress Management: - Provides group verbal and written instruction about the health risks of elevated stress, cause of high stress, and healthy ways to reduce stress. Flowsheet Row Cardiac Rehab from 02/28/2016 in [facility] Cardiac and Pulmonary Rehab  Date  02/28/16  Educator  [facility]  Instruction Review Code  2- meets goals/outcomes      Depression: - Provides group verbal and written instruction on the correlation between heart/lung disease and depressed mood, treatment options, and the stigmas associated with seeking treatment.   Anatomy & Physiology of the Heart: - Group verbal and written instruction and models provide basic cardiac anatomy and physiology, with the coronary electrical and arterial  systems. Review of: AMI, Angina, Valve disease, Heart Failure, Cardiac Arrhythmia, Pacemakers, and the ICD.   Cardiac Procedures: - Group verbal and written instruction and models to describe the testing methods done to diagnose heart disease. Reviews the outcomes of the test results. Describes the treatment choices: Medical Management, Angioplasty, or Coronary Bypass Surgery.   Cardiac Medications: - Group verbal and written instruction to review commonly prescribed medications for heart disease. Reviews the medication, class of the drug, and side effects. Includes the steps to properly store meds and maintain the prescription regimen.   Go Sex-Intimacy & Heart Disease, Get SMART - Goal Setting: - Group verbal and written instruction through game format to discuss heart disease and the return to sexual intimacy. Provides group  verbal and written material to discuss and apply goal setting through the application of the S.M.A.[name].T. Method.   Other Matters of the Heart: - Provides group verbal, written materials and models to describe Heart Failure, Angina, Valve Disease, and Diabetes in the realm of heart disease. Includes description of the disease process and treatment options available to the cardiac patient.   Exercise & Equipment Safety: - Individual verbal instruction and demonstration of equipment use and safety with use of the equipment. Flowsheet Row Cardiac Rehab from 02/28/2016 in [facility] Cardiac and Pulmonary Rehab  Date  02/05/16  Educator  C. EnterkinRN  Instruction Review Code  1- partially meets, needs review/practice      Infection Prevention: - Provides verbal and written material to individual with discussion of infection control including proper hand washing and proper equipment cleaning during exercise session. Flowsheet Row Cardiac Rehab from 02/28/2016 in [facility] Cardiac and Pulmonary Rehab  Date  02/05/16  Educator  C. EnterkinRN  Instruction Review Code  2- meets goals/outcomes      Falls Prevention: - Provides verbal and written material to individual with discussion of falls prevention and safety. Flowsheet Row Cardiac Rehab from 02/28/2016 in [facility] Cardiac and Pulmonary Rehab  Date  02/05/16  Educator  C. [place]  Instruction Review Code  2- meets goals/outcomes      Diabetes: - Individual verbal and written instruction to review signs/symptoms of diabetes, desired ranges of glucose level fasting, after meals and with exercise. Advice that pre and post exercise glucose checks will be done for 3 sessions at entry of program.    Knowledge Questionnaire Score:     Knowledge Questionnaire Score - 02/05/16 1258      Knowledge Questionnaire Score   Pre Score 26      Core Components/Risk Factors/Patient Goals at Admission:     Personal Goals and Risk Factors at Admission -  02/05/16 1300      Core Components/Risk Factors/Patient Goals on Admission   Sedentary Yes   Intervention Provide advice, education, support and counseling about physical activity/exercise needs.;Develop an individualized exercise prescription for aerobic and resistive training based on initial evaluation findings, risk stratification, comorbidities and participant's personal goals.   Expected Outcomes Achievement of increased cardiorespiratory fitness and enhanced flexibility, muscular endurance and strength shown through measurements of functional capacity and personal statement of participant.   Stress Yes   Intervention Offer individual and/or small group education and counseling on adjustment to heart disease, stress management and health-related lifestyle change. Teach and support self-help strategies.;Refer participants experiencing significant psychosocial distress to appropriate mental health specialists for further evaluation and treatment. When possible, include family members and significant others in education/counseling sessions.   Expected Outcomes Short Term: Participant demonstrates changes in health-related behavior,  relaxation and other stress management skills, ability to obtain effective social support, and compliance with psychotropic medications if prescribed.;Long Term: Emotional wellbeing is indicated by absence of clinically significant psychosocial distress or social isolation.      Core Components/Risk Factors/Patient Goals Review:      Goals and Risk Factor Review    Row Name 03/11/16 1812 03/28/16 1328           Core Components/Risk Factors/Patient Goals Review   Personal Goals Review Weight Management/Obesity;Stress Hypertension      Review Patient is maintaining weight. She is a life long member of weight watchers. She is still dealing with some anxiety and is meeting with her doctor tomorrow to discuss possible medication options.  Robins BP was lower at end of  session.  She will follow up with her Dr if she has any sypmtoms as she has a family history of low BP.      Expected Outcomes Patient meet with her doctor and possibly get some anxiety medications to help her cope with anxiety. She will continue to exercise and maintain her weight or lose 5-10 lbs which is her goal.  Shama will manage her BP and heart condition successfully.         Core Components/Risk Factors/Patient Goals at Discharge (Final Review):      Goals and Risk Factor Review - 03/28/16 1328      Core Components/Risk Factors/Patient Goals Review   Personal Goals Review Hypertension   Review Robins BP was lower at end of session.  She will follow up with her Dr if she has any sypmtoms as she has a family history of low BP.   Expected Outcomes Novia will manage her BP and heart condition successfully.      ITP Comments:     ITP Comments    Row Name 02/05/16 1300 02/05/16 1831 02/21/16 1117 03/20/16 0645 04/17/16 0638   ITP Comments Kaila has done Weight [employer] for years. [place] does not feel that she needs to meet individually with the Cardiac REhab Registered Dietician.  Intial ITP created today.  30 day review. Continue with ITP unless changes noted by Medical Director at signature of review. new to program 30 day review completed for Medical Director physician review and signature. Continue ITP unless changes made by physician. 30 day review completed for review by Dr Emily Filbert.  Continue with ITP unless changes noted by Dr Sabra Heck.      Comments:

## 2016-04-19 NOTE — Patient Instructions (Signed)
Discharge Instructions  Patient Details  Name: Tamara [name] MRN: [id number] Date of Birth: 1961/04/07 Referring Provider:  Yvonne KendallEnd, Christopher, MD   Number of Visits: 24  Reason for Discharge:  Patient reached a stable level of exercise. Patient independent in their exercise.  Smoking History:  History  Smoking Status  . Never Smoker  Smokeless Tobacco  . Never Used    Diagnosis:  ST elevation myocardial infarction (STEMI), unspecified artery (HCC)  Initial Exercise Prescription:     Initial Exercise Prescription - 02/05/16 1500      Date of Initial Exercise RX and Referring Provider   Date 02/05/16   Referring Provider End     Treadmill   MPH 3.2   Grade 1   Minutes 15   METs 3.89     Recumbant Bike   Level 2   RPM 60   Minutes 15   METs 4     NuStep   Level 3   Minutes 15   METs 4     Recumbant Elliptical   Level 2   RPM 50   Minutes 15   METs 4     Elliptical   Level 1   Speed 4   Minutes 15   METs 4     REL-XR   Level 3   Minutes 15   METs 4     Prescription Details   Frequency (times per week) 3   Duration Progress to 45 minutes of aerobic exercise without signs/symptoms of physical distress     Intensity   THRR 40-80% of Max Heartrate 109-147   Ratings of Perceived Exertion 11-13   Perceived Dyspnea 0-4     Progression   Progression Continue to progress workloads to maintain intensity without signs/symptoms of physical distress.     Resistance Training   Training Prescription Yes   Weight 3   Reps 10-12      Discharge Exercise Prescription (Final Exercise Prescription Changes):     Exercise Prescription Changes - 04/11/16 1100      Exercise Review   Progression Yes     Response to Exercise   Blood Pressure (Admit) 108/70   Blood Pressure (Exercise) 134/56   Blood Pressure (Exit) 88/52   Heart Rate (Admit) 85 bpm   Heart Rate (Exercise) 125 bpm   Heart Rate (Exit) 78 bpm   Rating of Perceived Exertion  (Exercise) 13   Symptoms none   Duration Progress to 45 minutes of aerobic exercise without signs/symptoms of physical distress   Intensity THRR unchanged     Progression   Progression Continue to progress workloads to maintain intensity without signs/symptoms of physical distress.   Average METs 6     Resistance Training   Training Prescription Yes   Weight 3   Reps 8-10     Interval Training   Interval Training No     Treadmill   MPH 3.4   Grade 3.5   Minutes 15   METs 5.24     REL-XR   Level 5   Minutes 15   METs 6.8      Functional Capacity:     6 Minute Walk    Row Name 02/05/16 1556 04/17/16 1737       6 Minute Walk   Phase  - Discharge    Distance 1442 feet 1890 feet    Walk Time 6 minutes 6 minutes    # of Rest Breaks 0  -    MPH 2.73  -  METS 4  -    RPE 9  -    VO2 Peak 14.2  -    Symptoms No  -    Resting HR 70 bpm 86 bpm    Resting BP 118/60 102/64    Max Ex. HR 97 bpm 106 bpm    Max Ex. BP 124/82 150/88       Quality of Life:     Quality of Life - 02/05/16 1257      Quality of Life Scores   Health/Function Pre 22.13 %   Socioeconomic Pre 28.07 %   Psych/Spiritual Pre 23.57 %   Family Pre 27 %   GLOBAL Pre 24.37 %      Personal Goals: Goals established at orientation with interventions provided to work toward goal.     Personal Goals and Risk Factors at Admission - 02/05/16 1300      Core Components/Risk Factors/Patient Goals on Admission   Sedentary Yes   Intervention Provide advice, education, support and counseling about physical activity/exercise needs.;Develop an individualized exercise prescription for aerobic and resistive training based on initial evaluation findings, risk stratification, comorbidities and participant's personal goals.   Expected Outcomes Achievement of increased cardiorespiratory fitness and enhanced flexibility, muscular endurance and strength shown through measurements of functional capacity and  personal statement of participant.   Stress Yes   Intervention Offer individual and/or small group education and counseling on adjustment to heart disease, stress management and health-related lifestyle change. Teach and support self-help strategies.;Refer participants experiencing significant psychosocial distress to appropriate mental health specialists for further evaluation and treatment. When possible, include family members and significant others in education/counseling sessions.   Expected Outcomes Short Term: Participant demonstrates changes in health-related behavior, relaxation and other stress management skills, ability to obtain effective social support, and compliance with psychotropic medications if prescribed.;Long Term: Emotional wellbeing is indicated by absence of clinically significant psychosocial distress or social isolation.       Personal Goals Discharge:     Goals and Risk Factor Review - 03/28/16 1328      Core Components/Risk Factors/Patient Goals Review   Personal Goals Review Hypertension   Review Robins BP was lower at end of session.  She will follow up with her Dr if she has any sypmtoms as she has a family history of low BP.   Expected Outcomes Shynia will manage her BP and heart condition successfully.      Nutrition & Weight - Outcomes:     Pre Biometrics - 02/05/16 1555      Pre Biometrics   Height 5' 3.8" (1.621 m)   Weight 144 lb 14.4 oz (65.7 kg)   Waist Circumference 28.5 inches   Hip Circumference 41 inches   Waist to Hip Ratio 0.7 %   BMI (Calculated) 25.1       Nutrition:     Nutrition Therapy & Goals - 02/05/16 1300      Nutrition Therapy   Drug/Food Interactions Statins/Certain Fruits     Personal Nutrition Goals   Comments Xzaria has done Weight Watchers for years. Leslieanne does not feel that she needs to meet individually with the Cardiac REhab Registered Dietician.      Intervention Plan   Intervention Prescribe, educate and  counsel regarding individualized specific dietary modifications aiming towards targeted core components such as weight, hypertension, lipid management, diabetes, heart failure and other comorbidities.   Expected Outcomes Short Term Goal: Understand basic principles of dietary content, such as calories, fat, sodium, cholesterol  and nutrients.;Long Term Goal: Adherence to prescribed nutrition plan.      Nutrition Discharge:   Education Questionnaire Score:     Knowledge Questionnaire Score - 02/05/16 1258      Knowledge Questionnaire Score   Pre Score 26      Goals reviewed with patient; copy given to patient.

## 2016-04-22 ENCOUNTER — Encounter: Payer: 59 | Admitting: *Deleted

## 2016-04-22 VITALS — Wt 148.0 lb

## 2016-04-22 DIAGNOSIS — I213 ST elevation (STEMI) myocardial infarction of unspecified site: Secondary | ICD-10-CM | POA: Diagnosis not present

## 2016-04-22 NOTE — Progress Notes (Signed)
Daily Session Note  Patient Details  Name: Tamara [name] MRN: [id number] Date of Birth: December 24, 1960 Referring Provider:   Flowsheet Row Cardiac Rehab from 02/05/2016 in [facility]  Referring Provider  End      Encounter Date: 04/22/2016  Check In:     Session Check In - 04/22/16 1646      Check-In   Location [facility]-Cardiac & Pulmonary Rehab   Staff Present Tamara Lark, RN, BSN, CCRP;Tamara Enterkin, RN, Tamara [name], BS, ACSM CEP, Exercise Physiologist   Supervising physician immediately available to respond to emergencies See telemetry face sheet for immediately available ER MD   Medication changes reported     No   Fall or balance concerns reported    No   Warm-up and Cool-down Performed on first and last piece of equipment   Resistance Training Performed Yes   VAD Patient? No     Pain Assessment   Currently in Pain? No/denies   Multiple Pain Sites No         Goals Met:  Independence with exercise equipment Exercise tolerated well No report of cardiac concerns or symptoms Strength training completed today  Goals Unmet:  Not Applicable  Comments:  Tamara [name] graduated today from cardiac rehab with 36 sessions completed.  Details of the patient's exercise prescription and what She needs to do in order to continue the prescription and progress were discussed with patient.  Patient was given a copy of prescription and goals.  Patient verbalized understanding.  Tamara [name] plans to continue to exercise by exercising at home.    Dr. Emily [name] is Medical Director for [facility].

## 2016-04-22 NOTE — Progress Notes (Signed)
Discharge Summary  Patient Details  Name: Tamara Tamara [name] MRN: [id number] Date of Birth: 16-Sep-1960 Referring Provider:   Flowsheet Row Cardiac Rehab from 02/05/2016 in [facility]  Referring Provider  End       Number of Visits: 24/24  Reason for Discharge:  Patient reached a stable level of exercise. Patient independent in their exercise.  Smoking History:  History  Smoking Status  . Never Smoker  Smokeless Tobacco  . Never Used    Diagnosis:  ST elevation myocardial infarction (STEMI), unspecified artery (HCC)  ADL [facility]SD:   Initial Exercise Prescription:     Initial Exercise Prescription - 02/05/16 1500      Date of Initial Exercise RX and Referring Provider   Date 02/05/16   Referring Provider End     Treadmill   MPH 3.2   Grade 1   Minutes 15   METs 3.89     Recumbant Bike   Level 2   RPM 60   Minutes 15   METs 4     NuStep   Level 3   Minutes 15   METs 4     Recumbant Elliptical   Level 2   RPM 50   Minutes 15   METs 4     Elliptical   Level 1   Speed 4   Minutes 15   METs 4     REL-XR   Level 3   Minutes 15   METs 4     Prescription Details   Frequency (times per week) 3   Duration Progress to 45 minutes of aerobic exercise without signs/symptoms of physical distress     Intensity   THRR 40-80% of Max Heartrate 109-147   Ratings of Perceived Exertion 11-13   Perceived Dyspnea 0-4     Progression   Progression Continue to progress workloads to maintain intensity without signs/symptoms of physical distress.     Resistance Training   Training Prescription Yes   Weight 3   Reps 10-12      Discharge Exercise Prescription (Final Exercise Prescription Changes):     Exercise Prescription Changes - 04/11/16 1100      Exercise Review   Progression Yes     Response to Exercise   Blood Pressure (Admit) 108/70   Blood Pressure (Exercise) 134/56   Blood Pressure (Exit) 88/52   Heart Rate (Admit) 85  bpm   Heart Rate (Exercise) 125 bpm   Heart Rate (Exit) 78 bpm   Rating of Perceived Exertion (Exercise) 13   Symptoms none   Duration Progress to 45 minutes of aerobic exercise without signs/symptoms of physical distress   Intensity THRR unchanged     Progression   Progression Continue to progress workloads to maintain intensity without signs/symptoms of physical distress.   Average METs 6     Resistance Training   Training Prescription Yes   Weight 3   Reps 8-10     Interval Training   Interval Training No     Treadmill   MPH 3.4   Grade 3.5   Minutes 15   METs 5.24     REL-XR   Level 5   Minutes 15   METs 6.8      Functional Capacity:     6 Minute Walk    Row Name 02/05/16 1556 04/17/16 1737       6 Minute Walk   Phase  - Discharge    Distance 1442 feet 1890 feet  Walk Time 6 minutes 6 minutes    # of Rest Breaks 0  -    MPH 2.73  -    METS 4  -    RPE 9  -    VO2 Peak 14.2  -    Symptoms No  -    Resting HR 70 bpm 86 bpm    Resting Tamara [name] 118/60 102/64    Max Ex. HR 97 bpm 106 bpm    Max Ex. Tamara [name] 124/82 150/88       Psychological, QOL, Others - Outcomes: PHQ 2/9: Depression screen PHQ 2/9 02/05/2016  Decreased Interest 1  Down, Depressed, Hopeless 1  PHQ - 2 Score 2  Altered sleeping 3  Tired, decreased energy 3  Change in appetite 2  Feeling bad or failure about yourself  1  Trouble concentrating 0  Moving slowly or fidgety/restless 0  Suicidal thoughts 0  PHQ-9 Score 11  Difficult doing work/chores Not difficult at all    Quality of Life:     Quality of Life - 02/05/16 1257      Quality of Life Scores   Health/Function Pre 22.13 %   Socioeconomic Pre 28.07 %   Psych/Spiritual Pre 23.57 %   Family Pre 27 %   GLOBAL Pre 24.37 %      Personal Goals: Goals established at orientation with interventions provided to work toward goal.     Personal Goals and Risk Factors at Admission - 02/05/16 1300      Core Components/Risk  Factors/Patient Goals on Admission   Sedentary Yes   Intervention Provide advice, education, support and counseling about physical activity/exercise needs.;Develop an individualized exercise prescription for aerobic and resistive training based on initial evaluation findings, risk stratification, comorbidities and participant's personal goals.   Expected Outcomes Achievement of increased cardiorespiratory fitness and enhanced flexibility, muscular endurance and strength shown through measurements of functional capacity and personal statement of participant.   Stress Yes   Intervention Offer individual and/or small group education and counseling on adjustment to heart disease, stress management and health-related lifestyle change. Teach and support self-help strategies.;Refer participants experiencing significant psychosocial distress to appropriate mental health specialists for further evaluation and treatment. When possible, include family members and significant others in education/counseling sessions.   Expected Outcomes Short Term: Participant demonstrates changes in health-related behavior, relaxation and other stress management skills, ability to obtain effective social support, and compliance with psychotropic medications if prescribed.;Long Term: Emotional wellbeing is indicated by absence of clinically significant psychosocial distress or social isolation.       Personal Goals Discharge:     Goals and Risk Factor Review    Row Name 03/11/16 1812 03/28/16 1328           Core Components/Risk Factors/Patient Goals Review   Personal Goals Review Weight Management/Obesity;Stress Hypertension      Review Patient is maintaining weight. Tamara Tamara [name] is a life long member of weight watchers. Tamara Tamara [name] is still dealing with some anxiety and is meeting with her doctor tomorrow to discuss possible medication options.  Tamara Tamara [name] was lower at end of session.  Tamara Tamara [name] will follow up with her Dr if Tamara Tamara [name] has any sypmtoms  as Tamara Tamara [name] has a family history of low Tamara [name].      Expected Outcomes Patient meet with her doctor and possibly get some anxiety medications to help her cope with anxiety. Tamara Tamara [name] will continue to exercise and maintain her weight or lose 5-10 lbs which is her goal.  Tamara Tamara [name] will manage  her Tamara [name] and heart condition successfully.         Nutrition & Weight - Outcomes:     Pre Biometrics - 02/05/16 1555      Pre Biometrics   Height 5' 3.8" (1.621 m)   Weight 144 lb 14.4 oz (65.7 kg)   Waist Circumference 28.5 inches   Hip Circumference 41 inches   Waist to Hip Ratio 0.7 %   BMI (Calculated) 25.1         Post Biometrics - 04/22/16 1705       Post  Biometrics   Weight 148 lb (67.1 kg)   Waist Circumference 30.5 inches   Hip Circumference 42 inches   Waist to Hip Ratio 0.73 %      Nutrition:     Nutrition Therapy & Goals - 02/05/16 1300      Nutrition Therapy   Drug/Food Interactions Statins/Certain Fruits     Personal Nutrition Goals   Comments Tamara Tamara [name] has done Weight Watchers for years. Tamara Tamara [name] does not feel that Tamara Tamara [name] needs to meet individually with the Cardiac REhab Registered Dietician.      Intervention Plan   Intervention Prescribe, educate and counsel regarding individualized specific dietary modifications aiming towards targeted core components such as weight, hypertension, lipid management, diabetes, heart failure and other comorbidities.   Expected Outcomes Short Term Goal: Understand basic principles of dietary content, such as calories, fat, sodium, cholesterol and nutrients.;Long Term Goal: Adherence to prescribed nutrition plan.      Nutrition Discharge:   Education Questionnaire Score:     Knowledge Questionnaire Score - 02/05/16 1258      Knowledge Questionnaire Score   Pre Score 26      Goals reviewed with patient; copy given to patient.

## 2016-04-22 NOTE — Progress Notes (Signed)
Cardiac Individual Treatment Plan  Patient Details  Name: Tamara [name] MRN: [id number] Date of Birth: [birth year] Referring Provider:   Flowsheet Row Cardiac Rehab from 02/05/2016 in [facility] Cardiac and Pulmonary Rehab  Referring Provider  End      Initial Encounter Date:  Flowsheet Row Cardiac Rehab from 02/05/2016 in [facility] Cardiac and Pulmonary Rehab  Date  02/05/16  Referring Provider  End      Visit Diagnosis: ST elevation myocardial infarction (STEMI), unspecified artery ([place])  Patient's Home Medications on Admission:  Current Outpatient Prescriptions:  .  aspirin EC 81 MG tablet, Take 81 mg by mouth daily. FOR HEART HEALTH, Disp: , Rfl:  .  atorvastatin (LIPITOR) 80 MG tablet, Take 80 mg by mouth at bedtime., Disp: , Rfl:  .  Cholecalciferol (VITAMIN D-3 PO), Take 1 tablet by mouth daily., Disp: , Rfl:  .  citalopram (CELEXA) 20 MG tablet, Take 20 mg by mouth daily., Disp: , Rfl:  .  cyclobenzaprine (FLEXERIL) 10 MG tablet, Take 10 mg by mouth 3 (three) times daily as needed for muscle spasms., Disp: , Rfl:  .  diclofenac sodium (VOLTAREN) 1 % GEL, Apply 2 g topically 4 (four) times daily., Disp: , Rfl:  .  docusate sodium (STOOL SOFTENER) 100 MG capsule, Take 100 mg by mouth daily as needed for constipation., Disp: , Rfl:  .  Fluocinonide 0.1 % CREA, Apply 1 application topically daily., Disp: , Rfl:  .  fluticasone (FLONASE) 50 MCG/ACT nasal spray, Place 2 sprays into both nostrils daily as needed for allergies., Disp: , Rfl:  .  metoprolol tartrate (LOPRESSOR) 25 MG tablet, Take 0.5 tablets (12.5 mg total) by mouth 2 (two) times daily., Disp: 30 tablet, Rfl: 6 .  Multiple Vitamins-Minerals (ONE-A-DAY WOMENS 50+ ADVANTAGE) TABS, Take 1 tablet by mouth daily., Disp: , Rfl:  .  nitroGLYCERIN (NITROSTAT) 0.4 MG SL tablet, Place 0.4 mg under the tongue every 5 (five) minutes as needed for chest pain., Disp: , Rfl:  .  ticagrelor (BRILINTA) 90 MG TABS tablet, Take 1 tablet (90 mg  total) by mouth 2 (two) times daily., Disp: 60 tablet, Rfl: 6 .  traZODone (DESYREL) 50 MG tablet, Take 50 mg by mouth at bedtime., Disp: , Rfl:  .  VITAMIN E PO, Take 1 capsule by mouth daily., Disp: , Rfl:   Past Medical History: Past Medical History:  Diagnosis Date  . CAD in native artery    a. PCI with DES to LAD (2008). b. Post STEMI 12/2015 2/2 complex LCx occ with occ of large OM1 and SCAD vs ulcerated plaque of Prox LCx s/p complex stenting of LCx/large OM1.  . Hyperlipemia   . Ischemic cardiomyopathy    a. EF 45-50% by admission 12/2015.  Marland Kitchen Myocardial infarct 05/13/2006   a. PCI with DES to LAD (2008)    Tobacco Use: History  Smoking Status  . Never Smoker  Smokeless Tobacco  . Never Used    Labs: Recent Review Flowsheet Data    Labs for ITP Cardiac and Pulmonary Rehab Latest Ref Rng & Units 01/11/2016 01/12/2016   Cholestrol 0 - 200 mg/dL - 121   LDLCALC 0 - 99 mg/dL - 54   HDL >40 mg/dL - 57   Trlycerides <150 mg/dL - 49   TCO2 0 - 100 mmol/L 25 -       Exercise Target Goals:    Exercise Program Goal: Individual exercise prescription set with THRR, safety & activity barriers. Participant demonstrates ability  to understand and report RPE using BORG scale, to self-measure pulse accurately, and to acknowledge the importance of the exercise prescription.  Exercise Prescription Goal: Starting with aerobic activity 30 plus minutes a day, 3 days per week for initial exercise prescription. Provide home exercise prescription and guidelines that participant acknowledges understanding prior to discharge.  Activity Barriers & Risk Stratification:     Activity Barriers & Cardiac Risk Stratification - 02/05/16 1250      Activity Barriers & Cardiac Risk Stratification   Activity Barriers Back Problems  fell down steps years ago and her back hurts at times.   Cardiac Risk Stratification High      6 Minute Walk:     6 Minute Walk    Row Name 02/05/16 1556 04/17/16  1737       6 Minute Walk   Phase  - Discharge    Distance 1442 feet 1890 feet    Walk Time 6 minutes 6 minutes    # of Rest Breaks 0  -    MPH 2.73  -    METS 4  -    RPE 9  -    VO2 Peak 14.2  -    Symptoms No  -    Resting HR 70 bpm 86 bpm    Resting BP 118/60 102/64    Max Ex. HR 97 bpm 106 bpm    Max Ex. BP 124/82 150/88       Initial Exercise Prescription:     Initial Exercise Prescription - 02/05/16 1500      Date of Initial Exercise RX and Referring Provider   Date 02/05/16   Referring Provider End     Treadmill   MPH 3.2   Grade 1   Minutes 15   METs 3.89     Recumbant Bike   Level 2   RPM 60   Minutes 15   METs 4     NuStep   Level 3   Minutes 15   METs 4     Recumbant Elliptical   Level 2   RPM 50   Minutes 15   METs 4     Elliptical   Level 1   Speed 4   Minutes 15   METs 4     REL-XR   Level 3   Minutes 15   METs 4     Prescription Details   Frequency (times per week) 3   Duration Progress to 45 minutes of aerobic exercise without signs/symptoms of physical distress     Intensity   THRR 40-80% of Max Heartrate 109-147   Ratings of Perceived Exertion 11-13   Perceived Dyspnea 0-4     Progression   Progression Continue to progress workloads to maintain intensity without signs/symptoms of physical distress.     Resistance Training   Training Prescription Yes   Weight 3   Reps 10-12      Perform Capillary Blood Glucose checks as needed.  Exercise Prescription Changes:     Exercise Prescription Changes    Row Name 02/29/16 1300 03/13/16 1300 03/18/16 1800 03/28/16 1300 04/11/16 1100     Exercise Review   Progression Yes Yes Yes Yes Yes     Response to Exercise   Blood Pressure (Admit) 118/64 122/80  - 102/56 108/70   Blood Pressure (Exercise) 176/74 146/70  - 116/68 134/56   Blood Pressure (Exit) 102/60 110/82  - 94/56 88/52   Heart Rate (Admit) 73 bpm 75 bpm  -  78 bpm 85 bpm   Heart Rate (Exercise) 85 bpm 100  bpm  - 104 bpm 125 bpm   Heart Rate (Exit) 69 bpm 68 bpm  - 72 bpm 78 bpm   Rating of Perceived Exertion (Exercise) 12 11  - 12 13   Symptoms none none  - none none   Duration Progress to 45 minutes of aerobic exercise without signs/symptoms of physical distress Progress to 45 minutes of aerobic exercise without signs/symptoms of physical distress Progress to 45 minutes of aerobic exercise without signs/symptoms of physical distress Progress to 45 minutes of aerobic exercise without signs/symptoms of physical distress Progress to 45 minutes of aerobic exercise without signs/symptoms of physical distress   Intensity THRR unchanged THRR unchanged THRR unchanged THRR unchanged THRR unchanged     Progression   Progression Continue to progress workloads to maintain intensity without signs/symptoms of physical distress. Continue to progress workloads to maintain intensity without signs/symptoms of physical distress. Continue to progress workloads to maintain intensity without signs/symptoms of physical distress. Continue to progress workloads to maintain intensity without signs/symptoms of physical distress. Continue to progress workloads to maintain intensity without signs/symptoms of physical distress.   Average METs 2.8 3.2 3.2 3.5 6     Resistance Training   Training Prescription Yes Yes Yes Yes Yes   Weight '3 3 3 3 3   '$ Reps 10-12 10-12 10-12 10-12 8-10     Interval Training   Interval Training No No No No No     Treadmill   MPH  - 3.2 3.2 3.3 3.4   Grade  - 1.5 1.5 1.5 3.5   Minutes  - '15 15 15 15   '$ METs  - 4.1 4.1 4.21 5.24     Recumbant Bike   Level  - 2 2  -  -   RPM  - 60 60  -  -   Minutes  - 15 15  -  -   METs  - 2.31 2.31  -  -     REL-XR   Level 4  -  - 5 5   Minutes 15  -  - 15 15   METs 2.53  -  - 3.5 6.8     T5 Nustep   Level 1  -  -  -  -   Minutes 15  -  -  -  -   METs 3.1  -  -  -  -     Home Exercise Plan   Plans to continue exercise at  -  - Home  -  -    Frequency  -  - Add 3 additional days to program exercise sessions.  -  -      Exercise Comments:     Exercise Comments    Row Name 02/14/16 1621 02/29/16 1346 03/13/16 1358 03/18/16 1807 03/28/16 1327   Exercise Comments First full day of exercise!  Patient was oriented to gym and equipment including functions, settings, policies, and procedures.  Patient's individual exercise prescription and treatment plan were reviewed.  All starting workloads were established based on the results of the 6 minute walk test done at initial orientation visit.  The plan for exercise progression was also introduced and progression will be customized based on patient's performance and goals. Nikisha is progressing well with exercise. Analissa contines to progress well with exercise. Home exercise reviewed with patient. She will be exercising at home on the days that she does not  come to class. She will walk, do band exercises, and stretches that she has learned in class.  Ladon continues to progress well with exercise.   [place] Name 04/11/16 1139 04/22/16 1803         Exercise Comments Durga has continued to progress adding intensity to her exercise.  Viveca graduated today from cardiac rehab with 36 sessions completed.  Details of the patient's exercise prescription and what She needs to do in order to continue the prescription and progress were discussed with patient.  Patient was given a copy of prescription and goals.  Patient verbalized understanding.  Tenise plans to continue to exercise by exercising at home.         Discharge Exercise Prescription (Final Exercise Prescription Changes):     Exercise Prescription Changes - 04/11/16 1100      Exercise Review   Progression Yes     Response to Exercise   Blood Pressure (Admit) 108/70   Blood Pressure (Exercise) 134/56   Blood Pressure (Exit) 88/52   Heart Rate (Admit) 85 bpm   Heart Rate (Exercise) 125 bpm   Heart Rate (Exit) 78 bpm   Rating of Perceived  Exertion (Exercise) 13   Symptoms none   Duration Progress to 45 minutes of aerobic exercise without signs/symptoms of physical distress   Intensity THRR unchanged     Progression   Progression Continue to progress workloads to maintain intensity without signs/symptoms of physical distress.   Average METs 6     Resistance Training   Training Prescription Yes   Weight 3   Reps 8-10     Interval Training   Interval Training No     Treadmill   MPH 3.4   Grade 3.5   Minutes 15   METs 5.24     REL-XR   Level 5   Minutes 15   METs 6.8      Nutrition:  Target Goals: Understanding of nutrition guidelines, daily intake of sodium '1500mg'$ , cholesterol '200mg'$ , calories 30% from fat and 7% or less from saturated fats, daily to have 5 or more servings of fruits and vegetables.  Biometrics:     Pre Biometrics - 02/05/16 1555      Pre Biometrics   Height 5' 3.8" (1.621 m)   Weight 144 lb 14.4 oz (65.7 kg)   Waist Circumference 28.5 inches   Hip Circumference 41 inches   Waist to Hip Ratio 0.7 %   BMI (Calculated) 25.1         Post Biometrics - 04/22/16 1705       Post  Biometrics   Weight 148 lb (67.1 kg)   Waist Circumference 30.5 inches   Hip Circumference 42 inches   Waist to Hip Ratio 0.73 %      Nutrition Therapy Plan and Nutrition Goals:     Nutrition Therapy & Goals - 02/05/16 1300      Nutrition Therapy   Drug/Food Interactions Statins/Certain Fruits     Personal Nutrition Goals   Comments Valerye has done Weight Watchers for years. Emalee does not feel that she needs to meet individually with the Cardiac REhab Registered Dietician.      Intervention Plan   Intervention Prescribe, educate and counsel regarding individualized specific dietary modifications aiming towards targeted core components such as weight, hypertension, lipid management, diabetes, heart failure and other comorbidities.   Expected Outcomes Short Term Goal: Understand basic principles of  dietary content, such as calories, fat, sodium, cholesterol and nutrients.;Long Term Goal:  Adherence to prescribed nutrition plan.      Nutrition Discharge: Rate Your Plate Scores:   Nutrition Goals Re-Evaluation:     Nutrition Goals Re-Evaluation    Row Name 03/11/16 1809             Personal Goal #1 Re-Evaluation   Comments Patient did not want to meet with the dietician. Stated she already eats heart healthy.           Psychosocial: Target Goals: Acknowledge presence or absence of depression, maximize coping skills, provide positive support system. Participant is able to verbalize types and ability to use techniques and skills needed for reducing stress and depression.  Initial Review & Psychosocial Screening:     Initial Psych Review & Screening - 02/05/16 1301      Initial Review   Current issues with Current Sleep Concerns;Current Stress Concerns     Barriers   Psychosocial barriers to participate in program The patient should benefit from training in stress management and relaxation.     Screening Interventions   Interventions Encouraged to exercise      Quality of Life Scores:     Quality of Life - 02/05/16 1257      Quality of Life Scores   Health/Function Pre 22.13 %   Socioeconomic Pre 28.07 %   Psych/Spiritual Pre 23.57 %   Family Pre 27 %   GLOBAL Pre 24.37 %      PHQ-9: Recent Review Flowsheet Data    Depression screen [facility] 2/9 02/05/2016   Decreased Interest 1   Down, Depressed, Hopeless 1   PHQ - 2 Score 2   Altered sleeping 3   Tired, decreased energy 3   Change in appetite 2   Feeling bad or failure about yourself  1   Trouble concentrating 0   Moving slowly or fidgety/restless 0   Suicidal thoughts 0   PHQ-9 Score 11   Difficult doing work/chores Not difficult at all      Psychosocial Evaluation and Intervention:     Psychosocial Evaluation - 04/22/16 1717      Discharge Psychosocial Assessment & Intervention   Comments  Counselor met with Ms. R today for discharge evaluation.  She reports continued progress with stamina; strength and mood and sleep since coming to this program.  She admits the medications for mood have helped significantly.  She plans to continue to exercise consistently but has not yet put a plan in place for this.  Counselor encouraged her to continue to work out several times each week to maintain the progress she has made and educated her on how quickly she can lose that if not consistent.  She agreed and mentioned attending a zumba class weekly.    Counselor commended her on the progress made and her positive self care.       Psychosocial Re-Evaluation:     Psychosocial Re-Evaluation    Haena Name 03/11/16 1810 03/20/16 1737 04/03/16 1655         Psychosocial Re-Evaluation   Comments Patient stated that she does still have job stress but feels able to manage it. She still does not sleep very well. 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  Counselor follow up with Ms. R today reporting she saw her Dr. last week and he prescribed Celexa for her anxiety and sleep issues.  She already is feeling some result of  being less anxious and maybe sleeping a little better.  Counselor commended Ms. R for advocating for her overall health, emotionally and physically.   Counselor follow up with Ms. R reporting she is definitely sleeping better since beginning the Celexa and has more energy.  She enjoys this class and looks forward to coming.  Counselor commended her for her consistency in exercise.        Vocational Rehabilitation: Provide vocational rehab assistance to qualifying candidates.   Vocational Rehab Evaluation & Intervention:     Vocational Rehab - 02/05/16 1258      Initial Vocational Rehab Evaluation & Intervention   Assessment shows need for Vocational Rehabilitation No       Education: Education Goals: Education classes will be provided on a weekly basis, covering required topics. Participant will state understanding/return demonstration of topics presented.  Learning Barriers/Preferences:     Learning Barriers/Preferences - 02/05/16 1251      Learning Barriers/Preferences   Learning Barriers None   Learning Preferences None      Education Topics: General Nutrition Guidelines/Fats and Fiber: -Group instruction provided by verbal, written material, models and posters to present the general guidelines for heart healthy nutrition. Gives an explanation and review of dietary fats and fiber.   Controlling Sodium/Reading Food Labels: -Group verbal and written material supporting the discussion of sodium use in heart healthy nutrition. Review and explanation with models, verbal and written materials for utilization of the food label.   Exercise Physiology & Risk Factors: - Group verbal and written instruction with models to review the exercise physiology of the cardiovascular system and associated critical values. Details cardiovascular disease risk factors and the goals associated with each risk factor.   Aerobic Exercise & Resistance Training: - Gives group verbal and written discussion on the health impact of inactivity. On the components of aerobic and resistive training programs and the benefits of this training and how to safely progress through these programs. Flowsheet Row Cardiac Rehab from 02/28/2016 in [facility] Cardiac and Pulmonary Rehab  Date  02/14/16  Educator  AS  Instruction Review Code  2- meets goals/outcomes      Flexibility, Balance, General Exercise Guidelines: - Provides group verbal and written instruction on the benefits of flexibility and balance training programs. Provides general exercise guidelines with specific guidelines to those with heart or lung disease. Demonstration and skill practice provided. Flowsheet Row Cardiac  Rehab from 02/28/2016 in [facility] Cardiac and Pulmonary Rehab  Date  02/19/16  Educator  [facility]  Instruction Review Code  2- meets goals/outcomes      Stress Management: - Provides group verbal and written instruction about the health risks of elevated stress, cause of high stress, and healthy ways to reduce stress. Flowsheet Row Cardiac Rehab from 02/28/2016 in [facility] Cardiac and Pulmonary Rehab  Date  02/28/16  Educator  [facility]  Instruction Review Code  2- meets goals/outcomes      Depression: - Provides group verbal and written instruction on the correlation between heart/lung disease and depressed mood, treatment options, and the stigmas associated with seeking treatment.   Anatomy & Physiology of the Heart: - Group verbal and written instruction and models provide basic cardiac anatomy and physiology, with the coronary electrical and arterial systems. Review of: AMI, Angina, Valve disease, Heart Failure, Cardiac Arrhythmia, Pacemakers, and the ICD.   Cardiac Procedures: - Group verbal and written instruction and models to describe the testing methods done to diagnose heart disease. Reviews the outcomes of the test results. Describes  the treatment choices: Medical Management, Angioplasty, or Coronary Bypass Surgery.   Cardiac Medications: - Group verbal and written instruction to review commonly prescribed medications for heart disease. Reviews the medication, class of the drug, and side effects. Includes the steps to properly store meds and maintain the prescription regimen.   Go Sex-Intimacy & Heart Disease, Get SMART - Goal Setting: - Group verbal and written instruction through game format to discuss heart disease and the return to sexual intimacy. Provides group verbal and written material to discuss and apply goal setting through the application of the S.M.A.R.T. Method.   Other Matters of the Heart: - Provides group verbal, written materials and models to describe Heart Failure,  Angina, Valve Disease, and Diabetes in the realm of heart disease. Includes description of the disease process and treatment options available to the cardiac patient.   Exercise & Equipment Safety: - Individual verbal instruction and demonstration of equipment use and safety with use of the equipment. Flowsheet Row Cardiac Rehab from 02/28/2016 in [facility] Cardiac and Pulmonary Rehab  Date  02/05/16  Educator  C. EnterkinRN  Instruction Review Code  1- partially meets, needs review/practice      Infection Prevention: - Provides verbal and written material to individual with discussion of infection control including proper hand washing and proper equipment cleaning during exercise session. Flowsheet Row Cardiac Rehab from 02/28/2016 in [facility] Cardiac and Pulmonary Rehab  Date  02/05/16  Educator  C. EnterkinRN  Instruction Review Code  2- meets goals/outcomes      Falls Prevention: - Provides verbal and written material to individual with discussion of falls prevention and safety. Flowsheet Row Cardiac Rehab from 02/28/2016 in [facility] Cardiac and Pulmonary Rehab  Date  02/05/16  Educator  C. [place]  Instruction Review Code  2- meets goals/outcomes      Diabetes: - Individual verbal and written instruction to review signs/symptoms of diabetes, desired ranges of glucose level fasting, after meals and with exercise. Advice that pre and post exercise glucose checks will be done for 3 sessions at entry of program.    Knowledge Questionnaire Score:     Knowledge Questionnaire Score - 02/05/16 1258      Knowledge Questionnaire Score   Pre Score 26      Core Components/Risk Factors/Patient Goals at Admission:     Personal Goals and Risk Factors at Admission - 02/05/16 1300      Core Components/Risk Factors/Patient Goals on Admission   Sedentary Yes   Intervention Provide advice, education, support and counseling about physical activity/exercise needs.;Develop an individualized  exercise prescription for aerobic and resistive training based on initial evaluation findings, risk stratification, comorbidities and participant's personal goals.   Expected Outcomes Achievement of increased cardiorespiratory fitness and enhanced flexibility, muscular endurance and strength shown through measurements of functional capacity and personal statement of participant.   Stress Yes   Intervention Offer individual and/or small group education and counseling on adjustment to heart disease, stress management and health-related lifestyle change. Teach and support self-help strategies.;Refer participants experiencing significant psychosocial distress to appropriate mental health specialists for further evaluation and treatment. When possible, include family members and significant others in education/counseling sessions.   Expected Outcomes Short Term: Participant demonstrates changes in health-related behavior, relaxation and other stress management skills, ability to obtain effective social support, and compliance with psychotropic medications if prescribed.;Long Term: Emotional wellbeing is indicated by absence of clinically significant psychosocial distress or social isolation.      Core Components/Risk Factors/Patient Goals Review:  Goals and Risk Factor Review    Row Name 03/11/16 1812 03/28/16 1328           Core Components/Risk Factors/Patient Goals Review   Personal Goals Review Weight Management/Obesity;Stress Hypertension      Review Patient is maintaining weight. She is a life long member of weight watchers. She is still dealing with some anxiety and is meeting with her doctor tomorrow to discuss possible medication options.  Robins BP was lower at end of session.  She will follow up with her Dr if she has any sypmtoms as she has a family history of low BP.      Expected Outcomes Patient meet with her doctor and possibly get some anxiety medications to help her cope with  anxiety. She will continue to exercise and maintain her weight or lose 5-10 lbs which is her goal.  Haylee will manage her BP and heart condition successfully.         Core Components/Risk Factors/Patient Goals at Discharge (Final Review):      Goals and Risk Factor Review - 03/28/16 1328      Core Components/Risk Factors/Patient Goals Review   Personal Goals Review Hypertension   Review Robins BP was lower at end of session.  She will follow up with her Dr if she has any sypmtoms as she has a family history of low BP.   Expected Outcomes Georgenia will manage her BP and heart condition successfully.      ITP Comments:     ITP Comments    Row Name 02/05/16 1300 02/05/16 1831 02/21/16 1117 03/20/16 0645 04/17/16 0638   ITP Comments Trixie has done Weight Watchers for years. Elody does not feel that she needs to meet individually with the Cardiac REhab Registered Dietician.  Intial ITP created today.  30 day review. Continue with ITP unless changes noted by Medical Director at signature of review. new to program 30 day review completed for Medical Director physician review and signature. Continue ITP unless changes made by physician. 30 day review completed for review by Dr Emily Filbert.  Continue with ITP unless changes noted by Dr Sabra Heck.   [place] Name 04/22/16 1836           ITP Comments Discharged          Comments: discharged

## 2016-04-22 NOTE — Patient Instructions (Signed)
Discharge Instructions  Patient Details  Name: Tamara [name] MRN: [id number] Date of Birth: 08-25-1960 Referring Provider:  Yvonne KendallEnd, Christopher, MD   Number of Visits: 24 out of 24  Reason for Discharge:  Patient reached a stable level of exercise. Patient independent in their exercise.  Smoking History:  History  Smoking Status  . Never Smoker  Smokeless Tobacco  . Never Used    Diagnosis:  ST elevation myocardial infarction (STEMI), unspecified artery (HCC)  Initial Exercise Prescription:     Initial Exercise Prescription - 02/05/16 1500      Date of Initial Exercise RX and Referring Provider   Date 02/05/16   Referring Provider End     Treadmill   MPH 3.2   Grade 1   Minutes 15   METs 3.89     Recumbant Bike   Level 2   RPM 60   Minutes 15   METs 4     NuStep   Level 3   Minutes 15   METs 4     Recumbant Elliptical   Level 2   RPM 50   Minutes 15   METs 4     Elliptical   Level 1   Speed 4   Minutes 15   METs 4     REL-XR   Level 3   Minutes 15   METs 4     Prescription Details   Frequency (times per week) 3   Duration Progress to 45 minutes of aerobic exercise without signs/symptoms of physical distress     Intensity   THRR 40-80% of Max Heartrate 109-147   Ratings of Perceived Exertion 11-13   Perceived Dyspnea 0-4     Progression   Progression Continue to progress workloads to maintain intensity without signs/symptoms of physical distress.     Resistance Training   Training Prescription Yes   Weight 3   Reps 10-12      Discharge Exercise Prescription (Final Exercise Prescription Changes):     Exercise Prescription Changes - 04/11/16 1100      Exercise Review   Progression Yes     Response to Exercise   Blood Pressure (Admit) 108/70   Blood Pressure (Exercise) 134/56   Blood Pressure (Exit) 88/52   Heart Rate (Admit) 85 bpm   Heart Rate (Exercise) 125 bpm   Heart Rate (Exit) 78 bpm   Rating of Perceived  Exertion (Exercise) 13   Symptoms none   Duration Progress to 45 minutes of aerobic exercise without signs/symptoms of physical distress   Intensity THRR unchanged     Progression   Progression Continue to progress workloads to maintain intensity without signs/symptoms of physical distress.   Average METs 6     Resistance Training   Training Prescription Yes   Weight 3   Reps 8-10     Interval Training   Interval Training No     Treadmill   MPH 3.4   Grade 3.5   Minutes 15   METs 5.24     REL-XR   Level 5   Minutes 15   METs 6.8      Functional Capacity:     6 Minute Walk    [place] Name 02/05/16 1556 04/17/16 1737       6 Minute Walk   Phase  - Discharge    Distance 1442 feet 1890 feet    Walk Time 6 minutes 6 minutes    # of Rest Breaks 0  -  MPH 2.73  -    METS 4  -    RPE 9  -    VO2 Peak 14.2  -    Symptoms No  -    Resting HR 70 bpm 86 bpm    Resting BP 118/60 102/64    Max Ex. HR 97 bpm 106 bpm    Max Ex. BP 124/82 150/88       Quality of Life:     Quality of Life - 02/05/16 1257      Quality of Life Scores   Health/Function Pre 22.13 %   Socioeconomic Pre 28.07 %   Psych/Spiritual Pre 23.57 %   Family Pre 27 %   GLOBAL Pre 24.37 %      Personal Goals: Goals established at orientation with interventions provided to work toward goal.     Personal Goals and Risk Factors at Admission - 02/05/16 1300      Core Components/Risk Factors/Patient Goals on Admission   Sedentary Yes   Intervention Provide advice, education, support and counseling about physical activity/exercise needs.;Develop an individualized exercise prescription for aerobic and resistive training based on initial evaluation findings, risk stratification, comorbidities and participant's personal goals.   Expected Outcomes Achievement of increased cardiorespiratory fitness and enhanced flexibility, muscular endurance and strength shown through measurements of functional  capacity and personal statement of participant.   Stress Yes   Intervention Offer individual and/or small group education and counseling on adjustment to heart disease, stress management and health-related lifestyle change. Teach and support self-help strategies.;Refer participants experiencing significant psychosocial distress to appropriate mental health specialists for further evaluation and treatment. When possible, include family members and significant others in education/counseling sessions.   Expected Outcomes Short Term: Participant demonstrates changes in health-related behavior, relaxation and other stress management skills, ability to obtain effective social support, and compliance with psychotropic medications if prescribed.;Long Term: Emotional wellbeing is indicated by absence of clinically significant psychosocial distress or social isolation.       Personal Goals Discharge:     Goals and Risk Factor Review - 03/28/16 1328      Core Components/Risk Factors/Patient Goals Review   Personal Goals Review Hypertension   Review Robins BP was lower at end of session.  She will follow up with her Dr if she has any sypmtoms as she has a family history of low BP.   Expected Outcomes Zella BallRobin will manage her BP and heart condition successfully.      Nutrition & Weight - Outcomes:     Pre Biometrics - 02/05/16 1555      Pre Biometrics   Height 5' 3.8" (1.621 m)   Weight 144 lb 14.4 oz (65.7 kg)   Waist Circumference 28.5 inches   Hip Circumference 41 inches   Waist to Hip Ratio 0.7 %   BMI (Calculated) 25.1         Post Biometrics - 04/22/16 1705       Post  Biometrics   Weight 148 lb (67.1 kg)   Waist Circumference 30.5 inches   Hip Circumference 42 inches   Waist to Hip Ratio 0.73 %      Nutrition:     Nutrition Therapy & Goals - 02/05/16 1300      Nutrition Therapy   Drug/Food Interactions Statins/Certain Fruits     Personal Nutrition Goals   Comments Zella BallRobin  has done Weight Watchers for years. Zella BallRobin does not feel that she needs to meet individually with the Cardiac REhab Registered Dietician.  Intervention Plan   Intervention Prescribe, educate and counsel regarding individualized specific dietary modifications aiming towards targeted core components such as weight, hypertension, lipid management, diabetes, heart failure and other comorbidities.   Expected Outcomes Short Term Goal: Understand basic principles of dietary content, such as calories, fat, sodium, cholesterol and nutrients.;Long Term Goal: Adherence to prescribed nutrition plan.      Nutrition Discharge:   Education Questionnaire Score:     Knowledge Questionnaire Score - 02/05/16 1258      Knowledge Questionnaire Score   Pre Score 26      Goals reviewed with patient; copy given to patient.

## 2016-06-03 ENCOUNTER — Encounter: Payer: Self-pay | Admitting: Internal Medicine

## 2016-07-24 ENCOUNTER — Ambulatory Visit (INDEPENDENT_AMBULATORY_CARE_PROVIDER_SITE_OTHER): Payer: 59 | Admitting: Internal Medicine

## 2016-07-24 ENCOUNTER — Encounter: Payer: Self-pay | Admitting: Internal Medicine

## 2016-07-24 VITALS — BP 110/74 | HR 52 | Ht 64.0 in | Wt 147.5 lb

## 2016-07-24 DIAGNOSIS — I2510 Atherosclerotic heart disease of native coronary artery without angina pectoris: Secondary | ICD-10-CM

## 2016-07-24 DIAGNOSIS — I255 Ischemic cardiomyopathy: Secondary | ICD-10-CM | POA: Diagnosis not present

## 2016-07-24 DIAGNOSIS — Z95820 Peripheral vascular angioplasty status with implants and grafts: Secondary | ICD-10-CM

## 2016-07-24 DIAGNOSIS — E785 Hyperlipidemia, unspecified: Secondary | ICD-10-CM

## 2016-07-24 DIAGNOSIS — Z959 Presence of cardiac and vascular implant and graft, unspecified: Secondary | ICD-10-CM

## 2016-07-24 NOTE — Progress Notes (Addendum)
Follow-up Outpatient Visit Date: 07/24/2016  Chief Complaint: Follow-up coronary artery disease  HPI:  [name] is a 56 y.o. year-old female with history of coronary artery disease s/p posterior STEMI in 12/2015 with bifurcation stenting of LCx/OM, ischemic cardiomyopathy with LVEF 45-50%, hyperlipidemia, and anxiety, who presents for follow-up of coronary artery disease.  I last saw her on 03/26/16. Since that time, [name] has done well. She continues to have occasional shortness of breath, often while seated at night. She also has some fatigue from time to time, though she continues to exercise without difficulty most days of the week. The patient denies chest pain, palpitations, lightheadedness, and edema. She remains on DAPT with aspirin and ticagrelor without significant bleeding.  --------------------------------------------------------------------------------------------------  Cardiovascular History & Procedures: Cardiovascular Problems:  Coronary artery disease status post MI x 2 (most recently 12/2015)  Ischemic cardiomyopathy  Risk Factors:  Known coronary artery disease, hyperlipidemia  Cath/PCI:  LHC/PCI (01/11/16): LMCA normal.  LAD with distal stent with mild ISR (~10%).  Hazy 50% stenosis in proximal LCx with occlusion of OM1.  Question spontaneous dissection versus plaque erosion and thrombosis of OM1.  40% ostial and 30% distal LCx stenoses also present.  RCA normal.  Successful PCI to LCx/OM with placement of 3 Promus Premier drug-eluting stents using DK-crush technique.  CV Surgery:  None  EP Procedures and Devices:  None  Non-Invasive Evaluation(s):  TTE (01/12/16): Mildly dilated LV with normal wall thickness and basal/mid inferior hypokinesis (LVEF 45-50%).  No significant valvular disease.  Normal RV size/function.  Upper normal to mildly elevated pulmonary artery pressure.  Normal central venous pressure.  Recent CV Pertinent Labs: Lab Results    Component Value Date   CHOL 121 01/12/2016   HDL 57 01/12/2016   LDLCALC 54 01/12/2016   TRIG 49 01/12/2016   CHOLHDL 2.1 01/12/2016   INR 1.10 01/11/2016   K 3.9 01/12/2016   K 4.3 06/09/2012   BUN 9 01/12/2016   BUN 12 06/09/2012   CREATININE 0.73 01/12/2016   CREATININE 0.71 06/09/2012     Past medical and surgical history were reviewed and updated in EPIC.   Outpatient Encounter Prescriptions as of 07/24/2016  Medication Sig  . aspirin EC 81 MG tablet Take 81 mg by mouth daily. FOR HEART HEALTH  . atorvastatin (LIPITOR) 80 MG tablet Take 80 mg by mouth at bedtime.  Marland Kitchen buPROPion (WELLBUTRIN) 75 MG tablet Take 75 mg by mouth 2 (two) times daily.  . Cholecalciferol (VITAMIN D-3 PO) Take 1 tablet by mouth daily.  . cyclobenzaprine (FLEXERIL) 10 MG tablet Take 10 mg by mouth 3 (three) times daily as needed for muscle spasms.  . diclofenac sodium (VOLTAREN) 1 % GEL Apply 2 g topically 4 (four) times daily.  Marland Kitchen docusate sodium (STOOL SOFTENER) 100 MG capsule Take 100 mg by mouth daily as needed for constipation.  . Fluocinonide 0.1 % CREA Apply 1 application topically daily.  . fluticasone (FLONASE) 50 MCG/ACT nasal spray Place 2 sprays into both nostrils daily as needed for allergies.  . metoprolol tartrate (LOPRESSOR) 25 MG tablet Take 0.5 tablets (12.5 mg total) by mouth 2 (two) times daily.  . Multiple Vitamins-Minerals (ONE-A-DAY WOMENS 50+ ADVANTAGE) TABS Take 1 tablet by mouth daily.  . nitroGLYCERIN (NITROSTAT) 0.4 MG SL tablet Place 0.4 mg under the tongue every 5 (five) minutes as needed for chest pain.  . ticagrelor (BRILINTA) 90 MG TABS tablet Take 1 tablet (90 mg total) by mouth 2 (two) times daily.  Marland Kitchen  traZODone (DESYREL) 50 MG tablet Take 50 mg by mouth at bedtime.  Marland Kitchen VITAMIN E PO Take 1 capsule by mouth daily.  . [DISCONTINUED] citalopram (CELEXA) 20 MG tablet Take 20 mg by mouth daily.   No facility-administered encounter medications on file as of 07/24/2016.      Allergies: Penicillins  Social History   Social History  . Marital status: Married    Spouse name: N/A  . Number of children: N/A  . Years of education: N/A   Occupational History  . Not on file.   Social History Main Topics  . Smoking status: Never Smoker  . Smokeless tobacco: Never Used  . Alcohol use No  . Drug use: No  . Sexual activity: Not on file   Other Topics Concern  . Not on file   Social History Narrative  . No narrative on file    Family History  Problem Relation Age of Onset  . Diabetes Mother   . CAD Father   . Polycystic kidney disease Father   . Aneurysm Sister   . Polycystic kidney disease Brother     Review of Systems: A 12-system review of systems was performed and was negative except as noted in the HPI.  --------------------------------------------------------------------------------------------------  Physical Exam: BP 110/74 (BP Location: Left Arm, Patient Position: Sitting, Cuff Size: Normal)   Pulse (!) 52   Ht 5\' 4"  (1.626 m)   Wt 147 lb 8 oz (66.9 kg)   BMI 25.32 kg/m   General:  Well-developed, well-nourished woman seated comfortably in the exam room. HEENT: Normocephalic and atraumatic without scalp hematoma. No conjunctival pallor or scleral icterus. Neck: Supple without lymphadenopathy, thyromegaly, JVD, or HJR. Lungs: Normal work of breathing.  Clear to auscultation bilaterally without wheezes or crackles. Heart: Bradycardiac but regular rhythm without murmurs, rubs, or gallops.  Non-displaced PMI. Abd: Bowel sounds present.  Soft, NT/ND without hepatosplenomegaly Ext: No lower extremity edema.  Radial, PT, and DP pulses are 2+ bilaterally. Skin: warm and dry without rash  EKG: Sinus bradycardia (HR 52 bpm); otherwise, no significant abnormalities.  Lab Results  Component Value Date   WBC 6.1 01/12/2016   HGB 12.3 01/12/2016   HCT 39.2 01/12/2016   MCV 84.8 01/12/2016   PLT 239 01/12/2016    Lab Results   Component Value Date   NA 138 01/12/2016   K 3.9 01/12/2016   CL 105 01/12/2016   CO2 23 01/12/2016   BUN 9 01/12/2016   CREATININE 0.73 01/12/2016   GLUCOSE 93 01/12/2016   ALT 19 01/11/2016    Lab Results  Component Value Date   CHOL 121 01/12/2016   HDL 57 01/12/2016   LDLCALC 54 01/12/2016   TRIG 49 01/12/2016   CHOLHDL 2.1 01/12/2016    --------------------------------------------------------------------------------------------------  ASSESSMENT AND PLAN: Coronary artery disease status post STEMI in 12/2015 [name] continues to do well following STEMI with PCI to LCx/OM bifurcation. We will continue with dual antiplatelet therapy for at least a year, ideally longer. We discussed the risks and benefits of switching from ticagrelor to an alternate agent (clopidogrel or prasugrel) given the patient's dyspnea and bradycardia. [name] reports that her symptoms are manageable and would like to continue current therapy. When she is 12 months out from her STEMI/PCI, we will discuss decreasing ticagrelor to 60 mg BID versus switching to clopidogrel We will also continue low-dose metoprolol and high-intensity statin therapy.  Ischemic cardiomyopathy Patient was noted to have mildly reduced LV function following her STEMI.  She appears euvolemic without heart failure signs or symptoms. We will continue low-dose metoprolol. We have deferred ACEI/ARB in the past due to soft blood pressure.  Hyperlipidemia LDL goal <70. Patient is tolerating atorvastatin 80 mg daily well. We will check a lipid panel and ALT today, with plans to continue her current dose unless side-effects arise.  Follow-up: Return to clinic in 6 months.  Yvonne Kendallhristopher Rumaisa Schnetzer, MD 07/24/2016 10:08 AM

## 2016-07-24 NOTE — Patient Instructions (Signed)
Medication Instructions:  Your physician recommends that you continue on your current medications as directed. Please refer to the Current Medication list given to you today.   Labwork: Your physician recommends that you return for lab work in: TODAY (LIPID, ALT).   Testing/Procedures: NONE  Follow-Up: Your physician wants you to follow-up in: 6 MONTHS WITH DR END. You will receive a reminder letter in the mail two months in advance. If you don't receive a letter, please call our office to schedule the follow-up appointment.  If you need a refill on your cardiac medications before your next appointment, please call your pharmacy.

## 2016-07-25 ENCOUNTER — Other Ambulatory Visit: Payer: Self-pay | Admitting: *Deleted

## 2016-07-25 DIAGNOSIS — Z79899 Other long term (current) drug therapy: Secondary | ICD-10-CM

## 2016-07-25 DIAGNOSIS — E785 Hyperlipidemia, unspecified: Secondary | ICD-10-CM

## 2016-07-25 LAB — LIPID PANEL
CHOLESTEROL TOTAL: 117 mg/dL (ref 100–199)
Chol/HDL Ratio: 2 ratio units (ref 0.0–4.4)
HDL: 59 mg/dL (ref >39)
LDL Calculated: 51 mg/dL (ref 0–99)
TRIGLYCERIDES: 33 mg/dL (ref 0–149)
VLDL CHOLESTEROL CAL: 7 mg/dL (ref 5–40)

## 2016-07-25 LAB — ALT: ALT: 37 IU/L — AB (ref 0–32)

## 2016-09-06 ENCOUNTER — Other Ambulatory Visit
Admission: RE | Admit: 2016-09-06 | Discharge: 2016-09-06 | Disposition: A | Discharge status: Home / Self Care | Payer: 59 | Source: Ambulatory Visit | Attending: Internal Medicine | Admitting: Internal Medicine

## 2016-09-06 DIAGNOSIS — Z79899 Other long term (current) drug therapy: Secondary | ICD-10-CM | POA: Insufficient documentation

## 2016-09-06 DIAGNOSIS — E785 Hyperlipidemia, unspecified: Secondary | ICD-10-CM | POA: Insufficient documentation

## 2016-09-06 LAB — LIPID PANEL
CHOL/HDL RATIO: 2.2 ratio
Cholesterol: 121 mg/dL (ref 0–200)
HDL: 54 mg/dL (ref >40)
LDL CALC: 60 mg/dL (ref 0–99)
TRIGLYCERIDES: 35 mg/dL (ref <150)
VLDL: 7 mg/dL (ref 0–40)

## 2016-09-06 LAB — ALT: ALT: 23 U/L (ref 14–54)

## 2016-10-15 ENCOUNTER — Encounter: Payer: Self-pay | Admitting: Internal Medicine

## 2016-10-16 ENCOUNTER — Ambulatory Visit (INDEPENDENT_AMBULATORY_CARE_PROVIDER_SITE_OTHER): Payer: 59 | Admitting: Internal Medicine

## 2016-10-16 ENCOUNTER — Encounter: Payer: Self-pay | Admitting: Internal Medicine

## 2016-10-16 ENCOUNTER — Telehealth: Payer: Self-pay | Admitting: *Deleted

## 2016-10-16 VITALS — BP 110/66 | HR 52 | Ht 64.0 in | Wt 144.5 lb

## 2016-10-16 DIAGNOSIS — R42 Dizziness and giddiness: Secondary | ICD-10-CM | POA: Diagnosis not present

## 2016-10-16 DIAGNOSIS — I25118 Atherosclerotic heart disease of native coronary artery with other forms of angina pectoris: Secondary | ICD-10-CM

## 2016-10-16 NOTE — Telephone Encounter (Signed)
Patient returned call to discuss recent episode of chest pain last night. Last night, chest pain was on the left side of chest and radiated to her back.  She waited about an hour, then took 1 nitro and chest pain went away. Denied SOB, nausea, vomiting, jaw or arm pain at that time. This morning, she had a small flutter in her left chest which went away. It was accompanied with some dizziness and fatigue. Currently, she is tired and weak and is not going to work today. Denies SOB, chest pain, or dizziness. She reports she is staying well hydrated and exercises regularly with no complaints. Patient scheduled to see Dr End this morning at 09:20 am. Advised that if she had reoccurrence of chest pain prior to appt to go straight to the ED. She verbalized understanding.

## 2016-10-16 NOTE — Telephone Encounter (Signed)
Received MyChart email from patient. Attempted to call patient to followup on recent episode of chest pain. No answer. Left detailed message, ok per DPR, to call us back to discuss if urgent matter and still having chest pain. Advised I will also respond to her email and she may respond that way if non-urgent.

## 2016-10-16 NOTE — Progress Notes (Signed)
Follow-up Outpatient Visit Date: 10/16/2016  Primary Care Provider: System, Pcp Not In No address on file  Chief Complaint: Chest pain  HPI:  Ms. Rolin is a 56 y.o. year-old female with history of coronary artery disease status post posterior STEMI in 12/2015 with bifurcation stenting of the LCx and OM, ischemic cardiomyopathy with LVEF of 45-50%, hyperlipidemia, and anxiety, who presents for evaluation of chest pain. She was feeling well until last night, when she had acute onset of sharp, left-sided chest pain with an intensity of 10/10 while shopping at [employer]. After approximately one hour, she took a single sublingual nitroglycerin tablet with prompt resolution of her pain. It has not recurred. However, she has continued to feel fatigued and dizzy. She feels as though her head is spinning. She has also had occasional lightheadedness but has not fallen. She has noted some sinus congestion and cough recently, which she has attributed to allergies. There have not been any recent medication changes. She has been compliant with her medications, including dual antiplatelet therapy with aspirin and ticagrelor. Ms. Su Hilt endorses lifting several heavy boxes a few days ago at work. She has not had any trauma to the chest. She denies shortness of breath, palpitations, orthopnea, PND, edema, and claudication.  --------------------------------------------------------------------------------------------------  Cardiovascular History & Procedures: Cardiovascular Problems:  Coronary artery disease status post MI x 2 (most recently 12/2015)  Ischemic cardiomyopathy  Risk Factors:  Known coronary artery disease, hyperlipidemia  Cath/PCI:  LHC/PCI (01/11/16): LMCA normal.  LAD with distal stent with mild ISR (~10%).  Hazy 50% stenosis in proximal LCx with occlusion of OM1.  Question spontaneous dissection versus plaque erosion and thrombosis of OM1.  40% ostial and 30% distal LCx stenoses also  present.  RCA normal.  Successful PCI to LCx/OM with placement of 3 Promus Premier drug-eluting stents using DK-crush technique.  CV Surgery:  None  EP Procedures and Devices:  None  Non-Invasive Evaluation(s):  TTE (01/12/16): Mildly dilated LV with normal wall thickness and basal/mid inferior hypokinesis (LVEF 45-50%).  No significant valvular disease.  Normal RV size/function.  Upper normal to mildly elevated pulmonary artery pressure.  Normal central venous pressure.  Recent CV Pertinent Labs: Lab Results  Component Value Date   CHOL 121 09/06/2016   CHOL 117 07/24/2016   HDL 54 09/06/2016   HDL 59 07/24/2016   LDLCALC 60 09/06/2016   LDLCALC 51 07/24/2016   TRIG 35 09/06/2016   CHOLHDL 2.2 09/06/2016   INR 1.10 01/11/2016   K 3.9 01/12/2016   K 4.3 06/09/2012   BUN 9 01/12/2016   BUN 12 06/09/2012   CREATININE 0.73 01/12/2016   CREATININE 0.71 06/09/2012    Past medical and surgical history were reviewed and updated in EPIC.  Outpatient Encounter Prescriptions as of 10/16/2016  Medication Sig  . aspirin EC 81 MG tablet Take 81 mg by mouth daily. FOR HEART HEALTH  . atorvastatin (LIPITOR) 80 MG tablet Take 40 mg by mouth at bedtime. Take 0.5 tablet (40 mg) by mouth once a day.  Marland Kitchen buPROPion (WELLBUTRIN) 75 MG tablet Take 75 mg by mouth 2 (two) times daily.  . Cholecalciferol (VITAMIN D-3 PO) Take 1 tablet by mouth daily.  . cyclobenzaprine (FLEXERIL) 10 MG tablet Take 10 mg by mouth 3 (three) times daily as needed for muscle spasms.  Marland Kitchen docusate sodium (STOOL SOFTENER) 100 MG capsule Take 100 mg by mouth daily as needed for constipation.  . Fluocinonide 0.1 % CREA Apply 1 application topically daily.  Marland Kitchen  fluticasone (FLONASE) 50 MCG/ACT nasal spray Place 2 sprays into both nostrils daily as needed for allergies.  . metoprolol tartrate (LOPRESSOR) 25 MG tablet Take 0.5 tablets (12.5 mg total) by mouth 2 (two) times daily.  . Multiple Vitamins-Minerals (ONE-A-DAY WOMENS  50+ ADVANTAGE) TABS Take 1 tablet by mouth daily.  . nitroGLYCERIN (NITROSTAT) 0.4 MG SL tablet Place 0.4 mg under the tongue every 5 (five) minutes as needed for chest pain.  . ticagrelor (BRILINTA) 90 MG TABS tablet Take 1 tablet (90 mg total) by mouth 2 (two) times daily.  . traZODone (DESYREL) 50 MG tablet Take 50 mg by mouth at bedtime.  Marland Kitchen. VITAMIN E PO Take 1 capsule by mouth daily.   No facility-administered encounter medications on file as of 10/16/2016.     Allergies: Penicillins  Social History   Social History  . Marital status: Married    Spouse name: N/A  . Number of children: N/A  . Years of education: N/A   Occupational History  . Not on file.   Social History Main Topics  . Smoking status: Never Smoker  . Smokeless tobacco: Never Used  . Alcohol use No  . Drug use: No  . Sexual activity: Not on file   Other Topics Concern  . Not on file   Social History Narrative  . No narrative on file    Family History  Problem Relation Age of Onset  . Diabetes Mother   . CAD Father   . Polycystic kidney disease Father   . Aneurysm Sister   . Polycystic kidney disease Brother     Review of Systems: Abdomen has felt more bloated over the last few weeks. Otherwise, a 12-system review of systems was performed and was negative except as noted in the HPI.  --------------------------------------------------------------------------------------------------  Physical Exam: BP 110/66 (BP Location: Left Arm, Patient Position: Sitting, Cuff Size: Normal)   Pulse (!) 52   Ht 5\' 4"  (1.626 m)   Wt 144 lb 8 oz (65.5 kg)   BMI 24.80 kg/m  Position Blood pressure (mmHg) Heart rate (bpm)  Lying 110/66  52   Sitting 102/70  58   Standing 106/70  56   Standing (3 minutes) 110/70  62    General:  Well-developed, well-nourished woman, seated comfortably in the exam room. HEENT: No conjunctival pallor or scleral icterus.  Moist mucous membranes.  OP clear. Neck: Supple without  lymphadenopathy, thyromegaly, JVD, or HJR.  No carotid bruit. Lungs: Normal work of breathing.  Clear to auscultation bilaterally without wheezes or crackles. Heart: Regular rate and rhythm without murmurs, rubs, or gallops.  Non-displaced PMI. Abd: Bowel sounds present.  Soft, NT/ND without hepatosplenomegaly Ext: No lower extremity edema.  Radial, PT, and DP pulses are 2+ bilaterally. Skin: warm and dry without rash Muscle skeletal: Reproducible sharp left-sided chest pain with palpation of the sternum and left ribs.  EKG:  Sinus bradycardia (52 bpm) without abnormalities.  Lab Results  Component Value Date   WBC 6.1 01/12/2016   HGB 12.3 01/12/2016   HCT 39.2 01/12/2016   MCV 84.8 01/12/2016   PLT 239 01/12/2016    Lab Results  Component Value Date   NA 138 01/12/2016   K 3.9 01/12/2016   CL 105 01/12/2016   CO2 23 01/12/2016   BUN 9 01/12/2016   CREATININE 0.73 01/12/2016   GLUCOSE 93 01/12/2016   ALT 23 09/06/2016    Lab Results  Component Value Date   CHOL 121 09/06/2016  HDL 54 09/06/2016   LDLCALC 60 09/06/2016   TRIG 35 09/06/2016   CHOLHDL 2.2 09/06/2016    --------------------------------------------------------------------------------------------------  ASSESSMENT AND PLAN: Coronary artery disease with atypical angina The patient's chest pain has both typical and atypical features. It is reproducible on exam today with palpation of the precordium. The patient also lifted heavy boxes last week, which may have contributed to costochondritis or other musculoskeletal pain. However, the fact that the pain resolved with sublingual nitroglycerin is worrisome for coronary insufficiency. Her EKG today does not have any ischemic findings and is unchanged from prior tracings. We have discussed inpatient versus outpatient evaluation and have agreed to the latter, given that her pain has resolved. We will refer Ms. Verne for an exercise myocardial perfusion stress test  within the next week. We will not make any medication changes today. I advised her to call 911 if she experiences any recurrence of her chest pain.  Dizziness This could be due to a number of factors. Her blood pressure is low normal today but does not demonstrate an orthostatic drop. She is bradycardic but mounts a reasonable heart rate response when standing up. Additionally, her sinus bradycardia is unchanged from her last visit. Given increased sinus congestion and cough that the patient attributes to allergies, I question if she may also have some sort of a sinus or in inner ear issue contributing to her symptoms. We will not make any medication changes today.  Follow-up: Return to clinic in 4-6 weeks.  [name], MD 10/16/2016 7:55 PM

## 2016-10-16 NOTE — Patient Instructions (Addendum)
Medication Instructions:  Your physician recommends that you continue on your current medications as directed. Please refer to the Current Medication list given to you today.   Labwork: none  Testing/Procedures: Your physician has requested that you have en exercise stress myoview. For further information please visit [email or web address] Please follow instruction sheet, as given.  [facility] MYOVIEW  Your caregiver has ordered a Stress Test with nuclear imaging. The purpose of this test is to evaluate the blood supply to your heart muscle. This procedure is referred to as a "Non-Invasive Stress Test." This is because other than having an IV started in your vein, nothing is inserted or "invades" your body. Cardiac stress tests are done to find areas of poor blood flow to the heart by determining the extent of coronary artery disease (CAD). Some patients exercise on a treadmill, which naturally increases the blood flow to your heart, while others who are  unable to walk on a treadmill due to physical limitations have a pharmacologic/chemical stress agent called Lexiscan . This medicine will mimic walking on a treadmill by temporarily increasing your coronary blood flow.   Please note: these test may take anywhere between 2-4 hours to complete  PLEASE REPORT TO [facility] MEDICAL MALL ENTRANCE  THE VOLUNTEERS AT THE FIRST DESK WILL DIRECT YOU WHERE TO GO  Date of Procedure:______06/11/18_________  Arrival Time for Procedure:______08:45 am__________  Instructions regarding medication:   __X__:  Hold betablocker (METOPROLOL) the night before procedure and morning of procedure   PLEASE NOTIFY THE OFFICE AT LEAST 24 HOURS IN ADVANCE IF YOU ARE UNABLE TO KEEP YOUR APPOINTMENT.  [phone] AND  PLEASE NOTIFY NUCLEAR MEDICINE AT [facility] AT LEAST 24 HOURS IN ADVANCE IF YOU ARE UNABLE TO KEEP YOUR APPOINTMENT. [phone]  How to prepare for your Myoview test:  1. Do not eat or drink after midnight 2. No  caffeine for 24 hours prior to test 3. No smoking 24 hours prior to test. 4. Your medication may be taken with water.  If your doctor stopped a medication because of this test, do not take that medication. 5. Ladies, please do not wear dresses.  Skirts or pants are appropriate. Please wear a short sleeve shirt. 6. No perfume, cologne or lotion. 7. Wear comfortable walking shoes. No heels!    Follow-Up: Your physician recommends that you schedule a follow-up appointment in: 4-6 WEEKS WITH DR END OR PA.   If you need a refill on your cardiac medications before your next appointment, please call your pharmacy.   Cardiac Nuclear Scan A cardiac nuclear scan is a test that measures blood flow to the heart when a person is resting and when he or she is exercising. The test looks for problems such as:  Not enough blood reaching a portion of the heart.  The heart muscle not working normally.  You may need this test if:  You have heart disease.  You have had abnormal lab results.  You have had heart surgery or angioplasty.  You have chest pain.  You have shortness of breath.  In this test, a radioactive dye (tracer) is injected into your bloodstream. After the tracer has traveled to your heart, an imaging device is used to measure how much of the tracer is absorbed by or distributed to various areas of your heart. This procedure is usually done at a hospital and takes 2-4 hours. Tell a health care provider about:  Any allergies you have.  All medicines you are taking, including vitamins, herbs, eye  drops, creams, and over-the-counter medicines.  Any problems you or family members have had with the use of anesthetic medicines.  Any blood disorders you have.  Any surgeries you have had.  Any medical conditions you have.  Whether you are pregnant or may be pregnant. What are the risks? Generally, this is a safe procedure. However, problems may occur, including:  Serious chest  pain and heart attack. This is only a risk if the stress portion of the test is done.  Rapid heartbeat.  Sensation of warmth in your chest. This usually passes quickly.  What happens before the procedure?  Ask your health care provider about changing or stopping your regular medicines. This is especially important if you are taking diabetes medicines or blood thinners.  Remove your jewelry on the day of the procedure. What happens during the procedure?  An IV tube will be inserted into one of your veins.  Your health care provider will inject a small amount of radioactive tracer through the tube.  You will wait for 20-40 minutes while the tracer travels through your bloodstream.  Your heart activity will be monitored with an electrocardiogram (ECG).  You will lie down on an exam table.  Images of your heart will be taken for about 15-20 minutes.  You may be asked to exercise on a treadmill or stationary bike. While you exercise, your heart's activity will be monitored with an ECG, and your blood pressure will be checked. If you are unable to exercise, you may be given a medicine to increase blood flow to parts of your heart.  When blood flow to your heart has peaked, a tracer will again be injected through the IV tube.  After 20-40 minutes, you will get back on the exam table and have more images taken of your heart.  When the procedure is over, your IV tube will be removed. The procedure may vary among health care providers and hospitals. Depending on the type of tracer used, scans may need to be repeated 3-4 hours later. What happens after the procedure?  Unless your health care provider tells you otherwise, you may return to your normal schedule, including diet, activities, and medicines.  Unless your health care provider tells you otherwise, you may increase your fluid intake. This will help flush the contrast dye from your body. Drink enough fluid to keep your urine clear or  pale yellow.  It is up to you to get your test results. Ask your health care provider, or the department that is doing the test, when your results will be ready. Summary  A cardiac nuclear scan measures the blood flow to the heart when a person is resting and when he or she is exercising.  You may need this test if you are at risk for heart disease.  Tell your health care provider if you are pregnant.  Unless your health care provider tells you otherwise, increase your fluid intake. This will help flush the contrast dye from your body. Drink enough fluid to keep your urine clear or pale yellow. This information is not intended to replace advice given to you by your health care provider. Make sure you discuss any questions you have with your health care provider. Document Released: 05/24/2004 Document Revised: 05/01/2016 Document Reviewed: 04/07/2013 Elsevier Interactive Patient Education  2017 [employer]

## 2016-11-27 ENCOUNTER — Ambulatory Visit: Payer: 59 | Admitting: Nurse Practitioner

## 2017-01-29 ENCOUNTER — Encounter: Payer: Self-pay | Admitting: Internal Medicine

## 2017-01-29 ENCOUNTER — Ambulatory Visit (INDEPENDENT_AMBULATORY_CARE_PROVIDER_SITE_OTHER): Payer: 59 | Admitting: Internal Medicine

## 2017-01-29 VITALS — BP 100/70 | HR 54 | Ht 64.0 in | Wt 147.5 lb

## 2017-01-29 DIAGNOSIS — I2510 Atherosclerotic heart disease of native coronary artery without angina pectoris: Secondary | ICD-10-CM

## 2017-01-29 DIAGNOSIS — I255 Ischemic cardiomyopathy: Secondary | ICD-10-CM | POA: Diagnosis not present

## 2017-01-29 DIAGNOSIS — E785 Hyperlipidemia, unspecified: Secondary | ICD-10-CM

## 2017-01-29 DIAGNOSIS — Z23 Encounter for immunization: Secondary | ICD-10-CM

## 2017-01-29 MED ORDER — TICAGRELOR 60 MG PO TABS: 60.0000 mg | ORAL_TABLET | Freq: Two times a day (BID) | ORAL | 3 refills | Status: DC

## 2017-01-29 NOTE — Progress Notes (Signed)
Follow-up Outpatient Visit Date: 01/29/2017  Primary Care Provider: System, Pcp Not In No address on file  Chief Complaint: Follow-up coronary artery disease  HPI:  Tamara [name] is a 56 y.o. year-old female with history of coronary artery disease status post PCI to the LAD and LCx/OM, ischemic cardiomyopathy with mildly reduced LVEF at the time of his STEMI in 12/2015, hyperlipidemia, and anxiety who presents for follow-up of coronary artery disease. I last saw her in June, at which time she described occasional vague chest discomfort. We agreed to obtain a stress test, though his [place] wound up canceling this test as her symptoms resolved spontaneously. In retrospect, she believes that her chest pain may have been due to anxiety. He has she has not had any further episodes of chest pain nor shortness of breath, palpitations, and lightheadedness. She is exercising regularly and participating in Weight [employer]. She notes that her weight loss has plateaued. She remains on aspirin and ticagrelor without bleeding. She is also tolerating high intensity statin therapy without myalgias.Marland Kitchen  --------------------------------------------------------------------------------------------------  Cardiovascular History & Procedures: Cardiovascular Problems:  Coronary artery disease status post MI x 2 (most recently 12/2015)  Ischemic cardiomyopathy  Risk Factors:  Known coronary artery disease, hyperlipidemia  Cath/PCI:  LHC/PCI (01/11/16): LMCA normal. LAD with distal stent with mild ISR (~10%). Hazy 50% stenosis in proximal LCx with occlusion of OM1. Question spontaneous dissection versus plaque erosion and thrombosis of OM1. 40% ostial and 30% distal LCx stenoses also present. RCA normal. Successful PCI to LCx/OM with placement of 3 Promus Premier drug-eluting stents using DK-crush technique.  CV Surgery:  None  EP Procedures and Devices:  None  Non-Invasive  Evaluation(s):  TTE (01/12/16): Mildly dilated LV with normal wall thickness and basal/mid inferior hypokinesis (LVEF 45-50%). No significant valvular disease. Normal RV size/function. Upper normal to mildly elevated pulmonary artery pressure. Normal central venous pressure.  Recent CV Pertinent Labs: Lab Results  Component Value Date   CHOL 121 09/06/2016   CHOL 117 07/24/2016   HDL 54 09/06/2016   HDL 59 07/24/2016   LDLCALC 60 09/06/2016   LDLCALC 51 07/24/2016   TRIG 35 09/06/2016   CHOLHDL 2.2 09/06/2016   INR 1.10 01/11/2016   K 3.9 01/12/2016   K 4.3 06/09/2012   BUN 9 01/12/2016   BUN 12 06/09/2012   CREATININE 0.73 01/12/2016   CREATININE 0.71 06/09/2012    Past medical and surgical history were reviewed and updated in EPIC.  Current Meds  Medication Sig  . aspirin EC 81 MG tablet Take 81 mg by mouth daily. FOR HEART HEALTH  . atorvastatin (LIPITOR) 80 MG tablet Take 40 mg by mouth at bedtime. Take 0.5 tablet (40 mg) by mouth once a day.  Marland Kitchen buPROPion (WELLBUTRIN) 75 MG tablet Take 75 mg by mouth 2 (two) times daily.  . Cholecalciferol (VITAMIN D-3 PO) Take 1 tablet by mouth daily.  . cyclobenzaprine (FLEXERIL) 10 MG tablet Take 10 mg by mouth 3 (three) times daily as needed for muscle spasms.  Marland Kitchen docusate sodium (STOOL SOFTENER) 100 MG capsule Take 100 mg by mouth daily as needed for constipation.  . Fluocinonide 0.1 % CREA Apply 1 application topically daily.  . fluticasone (FLONASE) 50 MCG/ACT nasal spray Place 2 sprays into both nostrils daily as needed for allergies.  . metoprolol tartrate (LOPRESSOR) 25 MG tablet Take 0.5 tablets (12.5 mg total) by mouth 2 (two) times daily.  . Multiple Vitamins-Minerals (ONE-A-DAY WOMENS 50+ ADVANTAGE) TABS Take 1 tablet by  mouth daily.  . nitroGLYCERIN (NITROSTAT) 0.4 MG SL tablet Place 0.4 mg under the tongue every 5 (five) minutes as needed for chest pain.  . ticagrelor (BRILINTA) 90 MG TABS tablet Take 1 tablet (90 mg total)  by mouth 2 (two) times daily.  . traZODone (DESYREL) 50 MG tablet Take 50 mg by mouth at bedtime.  Marland Kitchen VITAMIN E PO Take 1 capsule by mouth daily.    Allergies: Penicillins  Social History   Social History  . Marital status: Married    Spouse name: N/A  . Number of children: N/A  . Years of education: N/A   Occupational History  . Not on file.   Social History Main Topics  . Smoking status: Never Smoker  . Smokeless tobacco: Never Used  . Alcohol use No  . Drug use: No  . Sexual activity: Not on file   Other Topics Concern  . Not on file   Social History Narrative  . No narrative on file    Family History  Problem Relation Age of Onset  . Diabetes Mother   . CAD Father   . Polycystic kidney disease Father   . Aneurysm Sister   . Polycystic kidney disease Brother     Review of Systems: A 12-system review of systems was performed and was negative except as noted in the HPI.  --------------------------------------------------------------------------------------------------  Physical Exam: BP 100/70 (BP Location: Left Arm, Patient Position: Sitting, Cuff Size: Normal)   Pulse (!) 54   Ht  (1.626 m)   Wt 147 lb 8 oz (66.9 kg)   BMI 25.32 kg/m   General:  Well-developed, well-nourished woman seated comfortably in the exam room. HEENT: No conjunctival pallor or scleral icterus. Moist mucous membranes.  OP clear. Neck: Supple without lymphadenopathy, thyromegaly, JVD, or HJR.  Lungs: Normal work of breathing. Clear to auscultation bilaterally without wheezes or crackles. Heart: Bradycardic but regular without murmurs, rubs, or gallops. Non-displaced PMI. Abd: Bowel sounds present. Soft, NT/ND without hepatosplenomegaly Ext: No lower extremity edema. Radial, PT, and DP pulses are 2+ bilaterally. Skin: Warm and dry without rash.  EKG:  Sinus bradycardia (heart rate 54 bpm) nonspecific T-wave changes.  Lab Results  Component Value Date   WBC 6.1 01/12/2016    HGB 12.3 01/12/2016   HCT 39.2 01/12/2016   MCV 84.8 01/12/2016   PLT 239 01/12/2016    Lab Results  Component Value Date   NA 138 01/12/2016   K 3.9 01/12/2016   CL 105 01/12/2016   CO2 23 01/12/2016   BUN 9 01/12/2016   CREATININE 0.73 01/12/2016   GLUCOSE 93 01/12/2016   ALT 23 09/06/2016    Lab Results  Component Value Date   CHOL 121 09/06/2016   HDL 54 09/06/2016   LDLCALC 60 09/06/2016   TRIG 35 09/06/2016   CHOLHDL 2.2 09/06/2016    --------------------------------------------------------------------------------------------------  ASSESSMENT AND PLAN: Coronary artery disease without angina Ms. Bambrick feels well. She has not had any further episodes of chest pain, which prompted her to cancel stress test discussed at our last visit. She remains quite active without any symptoms to suggest worsening coronary insufficiency. We discussed the risks and benefits of extended dual antiplatelet therapy, and have agreed to continue with long-term aspirin and ticagrelor. We will decrease ticagrelor to 60 mg twice a day once she has exhausted her current supply. We will continue with secondary prevention, including high intensity statin therapy and low-dose metoprolol. She is tolerating mild sinus bradycardia well.  Ischemic cardiomyopathy Miss Knights appears euvolemic and well compensated today with NYHA class I symptoms. LVEF at the time of STEMI was only mildly reduced. No medication changes today; borderline low blood pressure and sinus bradycardia precluded up titration of metoprolol were addition of an ACE inhibitor/ARB.  Hyperlipidemia Goal LDL is less than 70; LDL in late April was 60. We will continue with atorvastatin 40 mg daily.  Influenza immunization Seasonal flu vaccine administered today at patient's request.  Follow-up: Return to clinic in 6 months.  [name], MD 01/29/2017 10:02 AM

## 2017-01-29 NOTE — Patient Instructions (Signed)
Medication Instructions:  Your physician has recommended you make the following change in your medication:  1- Once you have finished your current prescription of Brilinta, THEN DECREASE Brilinta to 60 mg by mouth two times a day. (A prescription has been sent to your pharmacy.)   Labwork:  none  Testing/Procedures: none  Follow-Up: Your physician wants you to follow-up in: 6 MONTHS WITH DR END. You will receive a reminder letter in the mail two months in advance. If you don't receive a letter, please call our office to schedule the follow-up appointment.   Any Other Special Instructions Will Be Listed Below (If Applicable).     If you need a refill on your cardiac medications before your next appointment, please call your pharmacy.

## 2017-07-30 ENCOUNTER — Ambulatory Visit: Payer: 59 | Admitting: Internal Medicine

## 2017-08-05 NOTE — Progress Notes (Signed)
Follow-up Outpatient Visit Date: 08/06/2017  Chief Complaint: Follow-up coronary artery disease  HPI:  Ms. Stotz is a 57 y.o. year-old female with history of  coronary artery disease status post PCI to the LAD and LCx/OM, ischemic cardiomyopathy with mildly reduced LVEF at the time of his STEMI in 12/2015, hyperlipidemia, and anxiety, who presents for follow-up of coronary artery disease.  I last saw Ms. Robson in 01/2017, at which time she was doing well.  Ms. Pizzimenti had noted some atypical chest pain earlier in the summer, prompting [place] to order a myocardial perfusion stress test.  However, as her pain resolved spontaneously, she opted not to proceed with the stress test.  We agreed to decrease ticagrelor to 60 mg twice daily with the plan for indefinite dual antiplatelet therapy.  Today, Ms. Drollinger reports doing well with the exception of 2 episodes of left lateral chest wall pain over the last few months.  The most recent episode occurred about a week ago.  She describes a throbbing sensation below the left axilla.  The pain can come and go and sometimes lasts a few days at a time.  She denies trauma to the area.  She has not noticed a rash.  The pain is not exertional or related to other activities.  There are no accompanying symptoms including dyspnea, palpitations, nausea, or diaphoresis.  She exercises regularly without any symptoms.  She has not needed to use any sublingual nitroglycerin since our last visit.  Blood pressure at home is well controlled, typically in the 110s over 60s.  She is tolerating her current medications well including dual antiplatelet therapy with aspirin and ticagrelor.  She denies bleeding.  --------------------------------------------------------------------------------------------------  Cardiovascular History & Procedures: Cardiovascular Problems:  Coronary artery disease status post MI x 2 (most recently 12/2015)  Ischemic cardiomyopathy  Risk  Factors:  Known coronary artery disease, hyperlipidemia  Cath/PCI:  LHC/PCI (01/11/16): LMCA normal. LAD with distal stent with mild ISR (~10%). Hazy 50% stenosis in proximal LCx with occlusion of OM1. Question spontaneous dissection versus plaque erosion and thrombosis of OM1. 40% ostial and 30% distal LCx stenoses also present. RCA normal. Successful PCI to LCx/OM with placement of 3 Promus Premier drug-eluting stents using DK-crush technique.  CV Surgery:  None  EP Procedures and Devices:  None  Non-Invasive Evaluation(s):  TTE (01/12/16): Mildly dilated LV with normal wall thickness and basal/mid inferior hypokinesis (LVEF 45-50%). No significant valvular disease. Normal RV size/function. Upper normal to mildly elevated pulmonary artery pressure. Normal central venous pressure.   Recent CV Pertinent Labs: Lab Results  Component Value Date   CHOL 121 09/06/2016   CHOL 117 07/24/2016   HDL 54 09/06/2016   HDL 59 07/24/2016   LDLCALC 60 09/06/2016   LDLCALC 51 07/24/2016   TRIG 35 09/06/2016   CHOLHDL 2.2 09/06/2016   INR 1.10 01/11/2016   K 3.9 01/12/2016   K 4.3 06/09/2012   BUN 9 01/12/2016   BUN 12 06/09/2012   CREATININE 0.73 01/12/2016   CREATININE 0.71 06/09/2012    Past medical and surgical history were reviewed and updated in EPIC.  Current Meds  Medication Sig  . aspirin EC 81 MG tablet Take 81 mg by mouth daily. FOR HEART HEALTH  . atorvastatin (LIPITOR) 80 MG tablet Take 40 mg by mouth at bedtime. Take 0.5 tablet (40 mg) by mouth once a day.  Marland Kitchen buPROPion (WELLBUTRIN) 75 MG tablet Take 75 mg by mouth 2 (two) times daily.  . Cholecalciferol (VITAMIN  D-3 PO) Take 1 tablet by mouth daily.  . cyclobenzaprine (FLEXERIL) 10 MG tablet Take 10 mg by mouth 3 (three) times daily as needed for muscle spasms.  Marland Kitchen. docusate sodium (STOOL SOFTENER) 100 MG capsule Take 100 mg by mouth daily as needed for constipation.  . Fluocinonide 0.1 % CREA Apply 1  application topically daily.  . fluticasone (FLONASE) 50 MCG/ACT nasal spray Place 2 sprays into both nostrils daily as needed for allergies.  . metoprolol tartrate (LOPRESSOR) 25 MG tablet Take 0.5 tablets (12.5 mg total) by mouth 2 (two) times daily.  . Multiple Vitamins-Minerals (ONE-A-DAY WOMENS 50+ ADVANTAGE) TABS Take 1 tablet by mouth daily.  . nitroGLYCERIN (NITROSTAT) 0.4 MG SL tablet Place 0.4 mg under the tongue every 5 (five) minutes as needed for chest pain.  . polyethylene glycol (MIRALAX / GLYCOLAX) packet Take 17 g by mouth 2 (two) times daily.  . ticagrelor (BRILINTA) 60 MG TABS tablet Take 1 tablet (60 mg total) by mouth 2 (two) times daily.  . traZODone (DESYREL) 50 MG tablet Take 50 mg by mouth at bedtime.  Marland Kitchen. VITAMIN E PO Take 1 capsule by mouth daily.    Allergies: Penicillins  Social History   Socioeconomic History  . Marital status: Married    Spouse name: Not on file  . Number of children: Not on file  . Years of education: Not on file  . Highest education level: Not on file  Occupational History  . Not on file  Social Needs  . Financial resource strain: Not on file  . Food insecurity:    Worry: Not on file    Inability: Not on file  . Transportation needs:    Medical: Not on file    Non-medical: Not on file  Tobacco Use  . Smoking status: Never Smoker  . Smokeless tobacco: Never Used  Substance and Sexual Activity  . Alcohol use: No  . Drug use: No  . Sexual activity: Not on file  Lifestyle  . Physical activity:    Days per week: Not on file    Minutes per session: Not on file  . Stress: Not on file  Relationships  . Social connections:    Talks on phone: Not on file    Gets together: Not on file    Attends religious service: Not on file    Active member of club or organization: Not on file    Attends meetings of clubs or organizations: Not on file    Relationship status: Not on file  . Intimate partner violence:    Fear of current or ex  partner: Not on file    Emotionally abused: Not on file    Physically abused: Not on file    Forced sexual activity: Not on file  Other Topics Concern  . Not on file  Social History Narrative  . Not on file    Family History  Problem Relation Age of Onset  . Diabetes Mother   . CAD Father   . Polycystic kidney disease Father   . Aneurysm Sister   . Polycystic kidney disease Brother     Review of Systems: A 12-system review of systems was performed and was negative except as noted in the HPI.  --------------------------------------------------------------------------------------------------  Physical Exam: BP 98/64 (BP Location: Left Arm, Patient Position: Sitting, Cuff Size: Normal)   Pulse (!) 59   Ht 5\' 4"  (1.626 m)   Wt 148 lb 12 oz (67.5 kg)   BMI 25.53 kg/m  General: NAD. HEENT: No conjunctival pallor or scleral icterus. Moist mucous membranes.  OP clear. Neck: Supple without lymphadenopathy, thyromegaly, JVD, or HJR. Lungs: Normal work of breathing. Clear to auscultation bilaterally without wheezes or crackles. Heart: Regular rate and rhythm without murmurs, rubs, or gallops. Non-displaced PMI.  No left lateral or precordial chest wall tenderness.  No deformity. Abd: Bowel sounds present. Soft, NT/ND without hepatosplenomegaly Ext: No lower extremity edema. Radial, PT, and DP pulses are 2+ bilaterally. Skin: Warm and dry without rash.  EKG: Sinus bradycardia (heart rate 59 bpm).  Otherwise, no abnormalities.  Lab Results  Component Value Date   WBC 6.1 01/12/2016   HGB 12.3 01/12/2016   HCT 39.2 01/12/2016   MCV 84.8 01/12/2016   PLT 239 01/12/2016    Lab Results  Component Value Date   NA 138 01/12/2016   K 3.9 01/12/2016   CL 105 01/12/2016   CO2 23 01/12/2016   BUN 9 01/12/2016   CREATININE 0.73 01/12/2016   GLUCOSE 93 01/12/2016   ALT 23 09/06/2016    Lab Results  Component Value Date   CHOL 121 09/06/2016   HDL 54 09/06/2016   LDLCALC 60  09/06/2016   TRIG 35 09/06/2016   CHOLHDL 2.2 09/06/2016    --------------------------------------------------------------------------------------------------  ASSESSMENT AND PLAN: Coronary artery disease with atypical angina Ms. Mcgrory's chest pain is atypical and most likely musculoskeletal.  However, given her history of multivessel CAD and bifurcation stenting in 12/2015, we have agreed to obtain an exercise myocardial perfusion stress test to exclude ischemia.  We will continue her current medications including indefinite dual antiplatelet therapy.  Ischemic cardiomyopathy No signs or symptoms of heart failure. We will continue her current medications. Gated SPECT images on stress test will also allow [place] to reassess LVEF.  Hyperlipidemia Outside labs at [facility] in 02/2017 showed LDL of 61. Continue atorvastatin 80 mg daily.  Follow-up: Return to clinic in 6 months, sooner if stress test is abnormal.  [name], MD 08/06/2017 8:36 AM

## 2017-08-06 ENCOUNTER — Encounter: Payer: Self-pay | Admitting: Internal Medicine

## 2017-08-06 ENCOUNTER — Ambulatory Visit: Payer: 59 | Admitting: Internal Medicine

## 2017-08-06 VITALS — BP 98/64 | HR 59 | Ht 64.0 in | Wt 148.8 lb

## 2017-08-06 DIAGNOSIS — I255 Ischemic cardiomyopathy: Secondary | ICD-10-CM

## 2017-08-06 DIAGNOSIS — I25119 Atherosclerotic heart disease of native coronary artery with unspecified angina pectoris: Secondary | ICD-10-CM

## 2017-08-06 DIAGNOSIS — E785 Hyperlipidemia, unspecified: Secondary | ICD-10-CM | POA: Diagnosis not present

## 2017-08-06 MED ORDER — TICAGRELOR 60 MG PO TABS: 60.0000 mg | ORAL_TABLET | Freq: Two times a day (BID) | ORAL | 3 refills | Status: DC

## 2017-08-06 NOTE — Patient Instructions (Addendum)
Medication Instructions:  Your physician recommends that you continue on your current medications as directed. Please refer to the Current Medication list given to you today.   Labwork: NONE  Testing/Procedures: Your physician has requested that you have en exercise stress myoview. For further information please visit [email or web address]. Please follow instruction sheet, as given.  [facility] MYOVIEW  Your caregiver has ordered a Stress Test with nuclear imaging. The purpose of this test is to evaluate the blood supply to your heart muscle. This procedure is referred to as a "Non-Invasive Stress Test." This is because other than having an IV started in your vein, nothing is inserted or "invades" your body. Cardiac stress tests are done to find areas of poor blood flow to the heart by determining the extent of coronary artery disease (CAD). Some patients exercise on a treadmill, which naturally increases the blood flow to your heart, while others who are  unable to walk on a treadmill due to physical limitations have a pharmacologic/chemical stress agent called Lexiscan . This medicine will mimic walking on a treadmill by temporarily increasing your coronary blood flow.   Please note: these test may take anywhere between 2-4 hours to complete  [facility]  THE VOLUNTEERS AT THE FIRST DESK WILL DIRECT YOU WHERE TO GO  Date of Procedure:_____________________________________  Arrival Time for Procedure:______________________________  Instructions regarding medication:   _XX_:  Hold betablocker(METOPROLOL) night before procedure and morning of procedure   PLEASE NOTIFY THE OFFICE AT LEAST 24 HOURS IN ADVANCE IF YOU ARE UNABLE TO KEEP YOUR APPOINTMENT.  [phone] AND  PLEASE NOTIFY NUC[facility]AR [facility]ICINE AT [facility] AT LEAST 24 HOURS IN ADVANCE IF YOU ARE UNABLE TO KEEP YOUR APPOINTMENT. [phone]  How to prepare for your Myoview test:  1. Do not eat or drink after  midnight 2. No caffeine for 24 hours prior to test 3. No smoking 24 hours prior to test. 4. Your medication may be taken with water.  If your doctor stopped a medication because of this test, do not take that medication. 5. Ladies, please do not wear dresses.  Skirts or pants are appropriate. Please wear a short sleeve shirt. 6. No perfume, cologne or lotion. 7. Wear comfortable walking shoes. No heels!    Follow-Up: Your physician wants you to follow-up in: 6 MONTHS WITH DR END. You will receive a reminder letter in the mail two months in advance. If you don't receive a letter, please call our office to schedule the follow-up appointment.  If you need a refill on your cardiac medications before your next appointment, please call your pharmacy.   Cardiac Nuclear Scan A cardiac nuclear scan is a test that measures blood flow to the heart when a person is resting and when he or she is exercising. The test looks for problems such as:  Not enough blood reaching a portion of the heart.  The heart muscle not working normally.  You may need this test if:  You have heart disease.  You have had abnormal lab results.  You have had heart surgery or angioplasty.  You have chest pain.  You have shortness of breath.  In this test, a radioactive dye (tracer) is injected into your bloodstream. After the tracer has traveled to your heart, an imaging device is used to measure how much of the tracer is absorbed by or distributed to various areas of your heart. This procedure is usually done at a hospital and takes 2-4 hours. Tell a  health care provider about:  Any allergies you have.  All medicines you are taking, including vitamins, herbs, eye drops, creams, and over-the-counter medicines.  Any problems you or family members have had with the use of anesthetic medicines.  Any blood disorders you have.  Any surgeries you have had.  Any medical conditions you have.  Whether you are  pregnant or may be pregnant. What are the risks? Generally, this is a safe procedure. However, problems may occur, including:  Serious chest pain and heart attack. This is only a risk if the stress portion of the test is done.  Rapid heartbeat.  Sensation of warmth in your chest. This usually passes quickly.  What happens before the procedure?  Ask your health care provider about changing or stopping your regular medicines. This is especially important if you are taking diabetes medicines or blood thinners.  Remove your jewelry on the day of the procedure. What happens during the procedure?  An IV tube will be inserted into one of your veins.  Your health care provider will inject a small amount of radioactive tracer through the tube.  You will wait for 20-40 minutes while the tracer travels through your bloodstream.  Your heart activity will be monitored with an electrocardiogram (ECG).  You will lie down on an exam table.  Images of your heart will be taken for about 15-20 minutes.  You may be asked to exercise on a treadmill or stationary bike. While you exercise, your heart's activity will be monitored with an ECG, and your blood pressure will be checked. If you are unable to exercise, you may be given a medicine to increase blood flow to parts of your heart.  When blood flow to your heart has peaked, a tracer will again be injected through the IV tube.  After 20-40 minutes, you will get back on the exam table and have more images taken of your heart.  When the procedure is over, your IV tube will be removed. The procedure may vary among health care providers and hospitals. Depending on the type of tracer used, scans may need to be repeated 3-4 hours later. What happens after the procedure?  Unless your health care provider tells you otherwise, you may return to your normal schedule, including diet, activities, and medicines.  Unless your health care provider tells you  otherwise, you may increase your fluid intake. This will help flush the contrast dye from your body. Drink enough fluid to keep your urine clear or pale yellow.  It is up to you to get your test results. Ask your health care provider, or the department that is doing the test, when your results will be ready. Summary  A cardiac nuclear scan measures the blood flow to the heart when a person is resting and when he or she is exercising.  You may need this test if you are at risk for heart disease.  Tell your health care provider if you are pregnant.  Unless your health care provider tells you otherwise, increase your fluid intake. This will help flush the contrast dye from your body. Drink enough fluid to keep your urine clear or pale yellow. This information is not intended to replace advice given to you by your health care provider. Make sure you discuss any questions you have with your health care provider. Document Released: 05/24/2004 Document Revised: 05/01/2016 Document Reviewed: 04/07/2013 Elsevier Interactive Patient Education  2017 [employer].

## 2017-08-19 ENCOUNTER — Encounter
Admission: RE | Admit: 2017-08-19 | Discharge: 2017-08-19 | Disposition: A | Discharge status: Home / Self Care | Payer: 59 | Source: Ambulatory Visit | Attending: Internal Medicine | Admitting: Internal Medicine

## 2017-08-19 DIAGNOSIS — I25119 Atherosclerotic heart disease of native coronary artery with unspecified angina pectoris: Secondary | ICD-10-CM | POA: Insufficient documentation

## 2017-08-19 LAB — NM MYOCAR MULTI W/SPECT W/WALL MOTION / EF
CSEPED: 11 min
CSEPHR: 96 %
Estimated workload: 13.4 METS
Exercise duration (sec): 15 s
LV sys vol: 32 mL
LVDIAVOL: 77 mL (ref 46–106)
NUC STRESS TID: 0.83
Peak HR: 160 {beats}/min
Rest HR: 59 {beats}/min

## 2017-08-19 MED ORDER — TECHNETIUM TC 99M TETROFOSMIN IV KIT
13.9600 | PACK | Freq: Once | INTRAVENOUS | Status: AC | PRN
Start: 2017-08-19 — End: 2017-08-19
  Administered 2017-08-19: 13.96 via INTRAVENOUS

## 2017-08-19 MED ORDER — TECHNETIUM TC 99M TETROFOSMIN IV KIT
30.1970 | PACK | Freq: Once | INTRAVENOUS | Status: AC | PRN
  Administered 2017-08-19: 30.197 via INTRAVENOUS

## 2018-02-03 NOTE — Progress Notes (Signed)
Follow-up Outpatient Visit Date: 02/04/2018  Primary Care Provider: Junious SilkKelly Fedoriw, MD [facility]  Chief Complaint: Follow-up coronary artery disease  HPI:  Tamara [name] is a 57 y.o. year-old female with history of coronary artery disease status post PCI to the LAD and LCx/OM,ischemic cardiomyopathy with mildly reduced LVEF at the time of his STEMI in 12/2015, hyperlipidemia, and anxiety, who presents for follow-up of coronary artery disease.  I last saw [name] in March, at which time she reported 2 episodes of left lateral chest wall pain.  She was exercising regularly without symptoms.  However, given her history of multivessel CAD, we agreed to perform a myocardial perfusion stress test.  The study was low risk with a small fixed mid inferolateral and apical lateral defect that may represent subtle scar and/or artifact.  LVEF was hyperdynamic.  Today, Tamara [name] reports feeling well.  She has not had any further episodes of chest pain since our last visit.  She was plagued by right knee problems for several months, which limited her activity.  However, this has improved following an injection.  She is getting back into an exercise program at this time.  She has not had any shortness of breath, palpitations, orthopnea, or edema.  She notes rare episodes of lightheadedness with accompanying soft blood pressures (as low as 98/47).  Blood pressures typically in the range of 100/60 without associated lightheadedness.  --------------------------------------------------------------------------------------------------  Cardiovascular History & Procedures: Cardiovascular Problems:  Coronary artery disease status post MI x 2 (most recently 12/2015)  Ischemic cardiomyopathy  Risk Factors:  Known coronary artery disease, hyperlipidemia  Cath/PCI:  LHC/PCI (01/11/16): LMCA normal. LAD with distal stent with mild ISR (~10%). Hazy 50% stenosis in proximal LCx with occlusion of  OM1. Question spontaneous dissection versus plaque erosion and thrombosis of OM1. 40% ostial and 30% distal LCx stenoses also present. RCA normal. Successful PCI to LCx/OM with placement of 3 Promus Premier drug-eluting stents using DK-crush technique.  CV Surgery:  None  EP Procedures and Devices:  None  Non-Invasive Evaluation(s):  Exercise myocardial perfusion stress test (08/19/17): Abnormal, low risk stress test with small in size, mild in severity, fixed mid inferolateral and apical lateral defect that may represent subtle scar and/or artifact.  No significant ischemia.  LVEF greater than 65%.  Good exercise capacity with rare PVCs at rest and during recovery.  TTE (01/12/16): Mildly dilated LV with normal wall thickness and basal/mid inferior hypokinesis (LVEF 45-50%). No significant valvular disease. Normal RV size/function. Upper normal to mildly elevated pulmonary artery pressure. Normal central venous pressure.  Recent CV Pertinent Labs: Lab Results  Component Value Date   CHOL 121 09/06/2016   CHOL 117 07/24/2016   HDL 54 09/06/2016   HDL 59 07/24/2016   LDLCALC 60 09/06/2016   LDLCALC 51 07/24/2016   TRIG 35 09/06/2016   CHOLHDL 2.2 09/06/2016   INR 1.10 01/11/2016   K 3.9 01/12/2016   K 4.3 06/09/2012   BUN 9 01/12/2016   BUN 12 06/09/2012   CREATININE 0.73 01/12/2016   CREATININE 0.71 06/09/2012    Past medical and surgical history were reviewed and updated in EPIC.  Current Meds  Medication Sig  . aspirin EC 81 MG tablet Take 81 mg by mouth daily. FOR HEART HEALTH  . atorvastatin (LIPITOR) 80 MG tablet Take 40 mg by mouth at bedtime. Take 0.5 tablet (40 mg) by mouth once a day.  Marland Kitchen. buPROPion (WELLBUTRIN) 75 MG tablet Take 75 mg by mouth 2 (  two) times daily.  . Cholecalciferol (VITAMIN D-3 PO) Take 1 tablet by mouth daily.  . cyclobenzaprine (FLEXERIL) 10 MG tablet Take 10 mg by mouth 3 (three) times daily as needed for muscle spasms.  Marland Kitchen docusate  sodium (STOOL SOFTENER) 100 MG capsule Take 100 mg by mouth daily as needed for constipation.  . Fluocinonide 0.1 % CREA Apply 1 application topically daily.  . metoprolol tartrate (LOPRESSOR) 25 MG tablet Take 0.5 tablets (12.5 mg total) by mouth 2 (two) times daily.  . Multiple Vitamins-Minerals (ONE-A-DAY WOMENS 50+ ADVANTAGE) TABS Take 1 tablet by mouth daily.  . nitroGLYCERIN (NITROSTAT) 0.4 MG SL tablet Place 0.4 mg under the tongue every 5 (five) minutes as needed for chest pain.  . polyethylene glycol (MIRALAX / GLYCOLAX) packet Take 17 g by mouth 2 (two) times daily.  . ticagrelor (BRILINTA) 60 MG TABS tablet Take 1 tablet (60 mg total) by mouth 2 (two) times daily.  . traZODone (DESYREL) 50 MG tablet Take 50 mg by mouth at bedtime.  Marland Kitchen VITAMIN E PO Take 1 capsule by mouth daily.    Allergies: Penicillins  Social History   Tobacco Use  . Smoking status: Never Smoker  . Smokeless tobacco: Never Used  Substance Use Topics  . Alcohol use: No  . Drug use: No    Family History  Problem Relation Age of Onset  . Diabetes Mother   . CAD Father   . Polycystic kidney disease Father   . Aneurysm Sister   . Polycystic kidney disease Brother     Review of Systems: A 12-system review of systems was performed and was negative except as noted in the HPI.  --------------------------------------------------------------------------------------------------  Physical Exam: BP 116/70 (BP Location: Left Arm, Patient Position: Sitting, Cuff Size: Normal)   Pulse 62   Ht 5\' 4"  (1.626 m)   Wt 147 lb 4 oz (66.8 kg)   BMI 25.28 kg/m   General: NAD. HEENT: No conjunctival pallor or scleral icterus. Moist mucous membranes.  OP clear. Neck: Supple without lymphadenopathy, thyromegaly, JVD, or HJR. No carotid bruit. Lungs: Normal work of breathing. Clear to auscultation bilaterally without wheezes or crackles. Heart: Regular rate and rhythm without murmurs, rubs, or gallops. Non-displaced  PMI. Abd: Bowel sounds present. Soft, NT/ND without hepatosplenomegaly Ext: No lower extremity edema. Radial, PT, and DP pulses are 2+ bilaterally. Skin: Warm and dry without rash.  EKG: Normal sinus rhythm without abnormalities.  Lab Results  Component Value Date   WBC 6.1 01/12/2016   HGB 12.3 01/12/2016   HCT 39.2 01/12/2016   MCV 84.8 01/12/2016   PLT 239 01/12/2016    Lab Results  Component Value Date   NA 138 01/12/2016   K 3.9 01/12/2016   CL 105 01/12/2016   CO2 23 01/12/2016   BUN 9 01/12/2016   CREATININE 0.73 01/12/2016   GLUCOSE 93 01/12/2016   ALT 23 09/06/2016    Lab Results  Component Value Date   CHOL 121 09/06/2016   HDL 54 09/06/2016   LDLCALC 60 09/06/2016   TRIG 35 09/06/2016   CHOLHDL 2.2 09/06/2016    --------------------------------------------------------------------------------------------------  ASSESSMENT AND PLAN: CAD without angina Patient is doing well without further chest pain.  Stress test this summer was normal.  Continue current medications for secondary prevention, including indefinite DAPT given multiple MI's and PCI's.  If symptomatic low blood pressures become an issue, we will need to consider stopping metoprolol in the future.  Hyperlipidemia LDL at goal on last check  with PCP in 02/2017.  She is due for annual visit with her PCP next month.  She should continue atorvastatin 80 mg daily with repeat lipid panel through her PCP (goal LDL < 70).  Influenza immunization Seasonal flu vaccine given today at the patient's request.  Follow-up: Return to clinic in 1 year.  [name], MD 02/04/2018 8:52 AM

## 2018-02-04 ENCOUNTER — Ambulatory Visit: Payer: 59 | Admitting: Internal Medicine

## 2018-02-04 ENCOUNTER — Encounter: Payer: Self-pay | Admitting: Internal Medicine

## 2018-02-04 VITALS — BP 116/70 | HR 62 | Ht 64.0 in | Wt 147.2 lb

## 2018-02-04 DIAGNOSIS — I2510 Atherosclerotic heart disease of native coronary artery without angina pectoris: Secondary | ICD-10-CM

## 2018-02-04 DIAGNOSIS — Z23 Encounter for immunization: Secondary | ICD-10-CM

## 2018-02-04 DIAGNOSIS — E785 Hyperlipidemia, unspecified: Secondary | ICD-10-CM | POA: Diagnosis not present

## 2018-02-04 MED ORDER — TICAGRELOR 60 MG PO TABS: 60.0000 mg | ORAL_TABLET | Freq: Two times a day (BID) | ORAL | 3 refills | Status: DC

## 2018-02-04 NOTE — Patient Instructions (Signed)
Medication Instructions:  Your physician recommends that you continue on your current medications as directed. Please refer to the Current Medication list given to you today.   Labwork: none  Testing/Procedures: none  Follow-Up: 12 MONTHS WITH DR END.   Your physician recommends that you schedule a follow-up appointment in: If you need a refill on your cardiac medications before your next appointment, please call your pharmacy.

## 2018-10-14 ENCOUNTER — Encounter: Payer: Self-pay | Admitting: Internal Medicine

## 2018-10-14 NOTE — Telephone Encounter (Signed)
Letter completed and available for review in Epic.

## 2018-10-14 NOTE — Telephone Encounter (Signed)
Spoke with patient. She preferred me to mail her a copy of the letter as well. Letter placed in the mail.

## 2019-02-10 ENCOUNTER — Ambulatory Visit: Payer: 59 | Admitting: Internal Medicine

## 2019-02-17 ENCOUNTER — Other Ambulatory Visit: Payer: Self-pay

## 2019-02-17 ENCOUNTER — Encounter: Payer: Self-pay | Admitting: Internal Medicine

## 2019-02-17 ENCOUNTER — Ambulatory Visit (INDEPENDENT_AMBULATORY_CARE_PROVIDER_SITE_OTHER): Payer: 59 | Admitting: Internal Medicine

## 2019-02-17 VITALS — BP 108/70 | HR 67 | Ht 64.0 in | Wt 154.0 lb

## 2019-02-17 DIAGNOSIS — Z23 Encounter for immunization: Secondary | ICD-10-CM | POA: Diagnosis not present

## 2019-02-17 DIAGNOSIS — I2510 Atherosclerotic heart disease of native coronary artery without angina pectoris: Secondary | ICD-10-CM

## 2019-02-17 DIAGNOSIS — E785 Hyperlipidemia, unspecified: Secondary | ICD-10-CM | POA: Diagnosis not present

## 2019-02-17 DIAGNOSIS — I255 Ischemic cardiomyopathy: Secondary | ICD-10-CM | POA: Diagnosis not present

## 2019-02-17 MED ORDER — TICAGRELOR 60 MG PO TABS: 60.0000 mg | ORAL_TABLET | Freq: Two times a day (BID) | ORAL | 5 refills | Status: DC

## 2019-02-17 NOTE — Progress Notes (Signed)
Follow-up Outpatient Visit Date: 02/17/2019  Primary Care Provider: Junious Silk, MD [name]  Chief Complaint: Follow-up CAD  HPI:  [name] is a 58 y.o. year-old female with history of coronary artery disease status post PCI to the LAD and LCx/OM,ischemic cardiomyopathy with mildly reduced LVEF at the time of his STEMI in 12/2015, hyperlipidemia, and anxiety, who presents for follow-up of coronary artery disease.  I last saw her a year ago, at which time she was doing well other than right knee pain that was limiting her activity.  We did not make any medication changes at that time.  Today, [name] reports that she continues to feel well without chest pain shortness of breath, palpitations, lightheadedness, or edema.  She is tolerating her medications well.  --------------------------------------------------------------------------------------------------  Cardiovascular History & Procedures: Cardiovascular Problems:  Coronary artery disease status post MI x 2 (most recently 12/2015)  Ischemic cardiomyopathy  Risk Factors:  Known coronary artery disease, hyperlipidemia  Cath/PCI:  LHC/PCI (01/11/16): LMCA normal. LAD with distal stent with mild ISR (~10%). Hazy 50% stenosis in proximal LCx with occlusion of OM1. Question spontaneous dissection versus plaque erosion and thrombosis of OM1. 40% ostial and 30% distal LCx stenoses also present. RCA normal. Successful PCI to LCx/OM with placement of 3 Promus Premier drug-eluting stents using DK-crush technique.  CV Surgery:  None  EP Procedures and Devices:  None  Non-Invasive Evaluation(s):  Exercise myocardial perfusion stress test (08/19/17): Abnormal, low risk stress test with small in size, mild in severity, fixed mid inferolateral and apical lateral defect that may represent subtle scar and/or artifact.  No significant ischemia.  LVEF greater than 65%.  Good exercise capacity with rare PVCs at rest and  during recovery.  TTE (01/12/16): Mildly dilated LV with normal wall thickness and basal/mid inferior hypokinesis (LVEF 45-50%). No significant valvular disease. Normal RV size/function. Upper normal to mildly elevated pulmonary artery pressure. Normal central venous pressure.  Recent CV Pertinent Labs: Lab Results  Component Value Date   CHOL 121 09/06/2016   CHOL 117 07/24/2016   HDL 54 09/06/2016   HDL 59 07/24/2016   LDLCALC 60 09/06/2016   LDLCALC 51 07/24/2016   TRIG 35 09/06/2016   CHOLHDL 2.2 09/06/2016   INR 1.10 01/11/2016   K 3.9 01/12/2016   K 4.3 06/09/2012   BUN 9 01/12/2016   BUN 12 06/09/2012   CREATININE 0.73 01/12/2016   CREATININE 0.71 06/09/2012    Past medical and surgical history were reviewed and updated in EPIC.  No outpatient medications have been marked as taking for the 02/17/19 encounter (Appointment) with Jarissa Sheriff, Cristal Deer, MD.    Allergies: Penicillins  Social History   Tobacco Use  . Smoking status: Never Smoker  . Smokeless tobacco: Never Used  Substance Use Topics  . Alcohol use: No  . Drug use: No    Family History  Problem Relation Age of Onset  . Diabetes Mother   . CAD Father   . Polycystic kidney disease Father   . Aneurysm Sister   . Polycystic kidney disease Brother     Review of Systems: A 12-system review of systems was performed and was negative except as noted in the HPI.  --------------------------------------------------------------------------------------------------  Physical Exam: There were no vitals taken for this visit.  General:  NAD HEENT: No conjunctival pallor or scleral icterus. Neck: Supple without lymphadenopathy, thyromegaly, JVD, or HJR. Lungs: Normal work of breathing. Clear to auscultation bilaterally without wheezes or crackles. Heart: Regular rate and rhythm without  murmurs, rubs, or gallops. Non-displaced PMI. Abd: Bowel sounds present. Soft, NT/ND without hepatosplenomegaly Ext: No lower  extremity edema. Radial, PT, and DP pulses are 2+ bilaterally. Skin: Warm and dry without rash.  EKG:  NSR w/o abnormality.  Lab Results  Component Value Date   WBC 6.1 01/12/2016   HGB 12.3 01/12/2016   HCT 39.2 01/12/2016   MCV 84.8 01/12/2016   PLT 239 01/12/2016    Lab Results  Component Value Date   NA 138 01/12/2016   K 3.9 01/12/2016   CL 105 01/12/2016   CO2 23 01/12/2016   BUN 9 01/12/2016   CREATININE 0.73 01/12/2016   GLUCOSE 93 01/12/2016   ALT 23 09/06/2016    Lab Results  Component Value Date   CHOL 121 09/06/2016   HDL 54 09/06/2016   LDLCALC 60 09/06/2016   TRIG 35 09/06/2016   CHOLHDL 2.2 09/06/2016    --------------------------------------------------------------------------------------------------  ASSESSMENT AND PLAN: CAD: No episodes of CP or shortness of breath.  EKG today is normal.  Continue current medications for secondary prevention, including indefinite DAPT with ASA and ticagrelor (60 mg BID).  Check CBC at her convenience.  Ischemic cardiomyopathy: [name] appears euvolemic with normalization of LVEF post-PCI in 12/2015.  Continue low-dose metoprolol  Hyperlipidemia: [name] is tolerating high-intensity statin therapy well.  We will check a FLP and CMP at her convenience.  Influenza immunization: Seasonal flu vaccine administered at the patient's request today.  Follow-up: RTC 1 year.  Nelva Bush, MD 02/17/2019 7:36 AM

## 2019-02-17 NOTE — Patient Instructions (Signed)
You will be given the flu shot today.   Medication Instructions:  Your physician recommends that you continue on your current medications as directed. Please refer to the Current Medication list given to you today.  If you need a refill on your cardiac medications before your next appointment, please call your pharmacy.   Lab work: Your physician recommends that you return for lab work in: at your convenience to check CBC, CMET, LIPID. - Please go to the [facility]. You will check in at the front desk to the right as you walk into the atrium. Valet Parking is offered if needed. - You will need to be FASTING. - No appointment needed. Go anytime between 7 am and 6 pm any day of the week.   If you have labs (blood work) drawn today and your tests are completely normal, you will receive your results only by: Marland Kitchen MyChart Message (if you have MyChart) OR . A paper copy in the mail If you have any lab test that is abnormal or we need to change your treatment, we will call you to review the results.  Testing/Procedures: NONE  Follow-Up: At [facility], you and your health needs are our priority.  As part of our continuing mission to provide you with exceptional heart care, we have created designated Provider Care Teams.  These Care Teams include your primary Cardiologist (physician) and Advanced Practice Providers (APPs -  Physician Assistants and Nurse Practitioners) who all work together to provide you with the care you need, when you need it. You will need a follow up appointment in 12 months.   Please call our office 2 months in advance to schedule this appointment.  You may see DR Harrell Gave END or one of the following Advanced Practice Providers on your designated Care Team:   Murray Hodgkins, NP Christell Faith, PA-C . Marrianne Mood, PA-C

## 2019-02-18 ENCOUNTER — Encounter: Payer: Self-pay | Admitting: Internal Medicine

## 2019-02-18 ENCOUNTER — Other Ambulatory Visit
Admission: RE | Admit: 2019-02-18 | Discharge: 2019-02-18 | Disposition: A | Discharge status: Home / Self Care | Payer: 59 | Source: Ambulatory Visit | Attending: Internal Medicine | Admitting: Internal Medicine

## 2019-02-18 DIAGNOSIS — I255 Ischemic cardiomyopathy: Secondary | ICD-10-CM | POA: Diagnosis present

## 2019-02-18 DIAGNOSIS — I2510 Atherosclerotic heart disease of native coronary artery without angina pectoris: Secondary | ICD-10-CM

## 2019-02-18 DIAGNOSIS — E785 Hyperlipidemia, unspecified: Secondary | ICD-10-CM

## 2019-02-18 LAB — CBC WITH DIFFERENTIAL/PLATELET
Abs Immature Granulocytes: 0.02 10*3/uL (ref 0.00–0.07)
Basophils Absolute: 0 10*3/uL (ref 0.0–0.1)
Basophils Relative: 1 %
Eosinophils Absolute: 0 10*3/uL (ref 0.0–0.5)
Eosinophils Relative: 1 %
HCT: 39 % (ref 36.0–46.0)
Hemoglobin: 12.5 g/dL (ref 12.0–15.0)
Immature Granulocytes: 0 %
Lymphocytes Relative: 17 %
Lymphs Abs: 1.1 10*3/uL (ref 0.7–4.0)
MCH: 27.2 pg (ref 26.0–34.0)
MCHC: 32.1 g/dL (ref 30.0–36.0)
MCV: 85 fL (ref 80.0–100.0)
Monocytes Absolute: 0.6 10*3/uL (ref 0.1–1.0)
Monocytes Relative: 10 %
Neutro Abs: 4.5 10*3/uL (ref 1.7–7.7)
Neutrophils Relative %: 71 %
Platelets: 253 10*3/uL (ref 150–400)
RBC: 4.59 MIL/uL (ref 3.87–5.11)
RDW: 13.5 % (ref 11.5–15.5)
WBC: 6.3 10*3/uL (ref 4.0–10.5)
nRBC: 0 % (ref 0.0–0.2)

## 2019-02-18 LAB — LIPID PANEL
Cholesterol: 132 mg/dL (ref 0–200)
HDL: 63 mg/dL (ref >40)
LDL Cholesterol: 64 mg/dL (ref 0–99)
Total CHOL/HDL Ratio: 2.1 RATIO
Triglycerides: 24 mg/dL (ref <150)
VLDL: 5 mg/dL (ref 0–40)

## 2019-02-18 LAB — COMPREHENSIVE METABOLIC PANEL
ALT: 26 U/L (ref 0–44)
AST: 25 U/L (ref 15–41)
Albumin: 3.9 g/dL (ref 3.5–5.0)
Alkaline Phosphatase: 63 U/L (ref 38–126)
Anion gap: 5 (ref 5–15)
BUN: 25 mg/dL — ABNORMAL HIGH (ref 6–20)
CO2: 28 mmol/L (ref 22–32)
Calcium: 8.9 mg/dL (ref 8.9–10.3)
Chloride: 107 mmol/L (ref 98–111)
Creatinine, Ser: 0.87 mg/dL (ref 0.44–1.00)
GFR calc Af Amer: >60 mL/min (ref >60)
GFR calc non Af Amer: >60 mL/min (ref >60)
Glucose, Bld: 87 mg/dL (ref 70–99)
Potassium: 4.2 mmol/L (ref 3.5–5.1)
Sodium: 140 mmol/L (ref 135–145)
Total Bilirubin: 0.8 mg/dL (ref 0.3–1.2)
Total Protein: 7 g/dL (ref 6.5–8.1)

## 2019-04-05 ENCOUNTER — Other Ambulatory Visit: Payer: Self-pay

## 2019-04-05 DIAGNOSIS — Z20822 Contact with and (suspected) exposure to covid-19: Secondary | ICD-10-CM

## 2019-04-06 LAB — NOVEL CORONAVIRUS, NAA: SARS-CoV-2, NAA: DETECTED — AB

## 2019-04-07 ENCOUNTER — Other Ambulatory Visit: Payer: Self-pay | Admitting: Nurse Practitioner

## 2019-04-07 DIAGNOSIS — I2510 Atherosclerotic heart disease of native coronary artery without angina pectoris: Secondary | ICD-10-CM

## 2019-04-07 DIAGNOSIS — U071 COVID-19: Secondary | ICD-10-CM

## 2019-04-07 NOTE — Progress Notes (Signed)
  I connected by phone with Tamara [name] on 04/07/2019 at 1:26 PM to discuss the potential use of an new treatment for mild to moderate COVID-19 viral infection in non-hospitalized patients.  This patient is a 58 y.o. female that meets the FDA criteria for Emergency Use Authorization of bamlanivimab:  Has a (+) direct SARS-CoV-2 viral test result  Has mild or moderate COVID-19   Is ? 56 years of age and weighs ? 40 kg  Is NOT hospitalized due to COVID-19  Is NOT requiring oxygen therapy or requiring an increase in baseline oxygen flow rate due to COVID-19  Is within 10 days of symptom onset - loss of taste and smell, headache, and body aches  Has at least one of the high risk factor(s) for progression to severe COVID-19 and/or hospitalization as defined in EUA.  Specific high risk criteria : Cardiovascular Disease  Patient Active Problem List   Diagnosis Date Noted  . Coronary artery disease involving native coronary artery of native heart with angina pectoris ([place]) 08/06/2017  . Cardiomyopathy, ischemic 01/13/2016  . Coronary artery disease without angina pectoris 01/13/2016  . Hyperlipidemia LDL goal <70 01/11/2016    I have spoken and communicated the following to the patient or parent/caregiver:  1. FDA has authorized the emergency use of bamlanivimab for the treatment of mild to moderate COVID-19 in adults and pediatric patients with positive results of direct SARS-CoV-2 viral testing who are 67 years of age and older weighing at least 40 kg, and who are at high risk for progressing to severe COVID-19 and/or hospitalization.  2. The significant known and potential risks and benefits of bamlanivimab, and the extent to which such potential risks and benefits are unknown.  3. Information on available alternative treatments and the risks and benefits of those alternatives, including clinical trials.  4. Patients treated with bamlanivimab should continue to self-isolate and  use infection control measures (e.g., wear mask, isolate, social distance, avoid sharing personal items, clean and disinfect "high touch" surfaces, and frequent handwashing) according to CDC guidelines.   5. The patient or parent/caregiver has the option to accept or refuse bamlanivimab.  After reviewing this information with the patient, The patient agreed to proceed with receiving the infusion of bamlanivimab and will be provided a copy of the Fact sheet prior to receiving the infusion.  Fenton Foy 04/07/2019 1:26 PM

## 2019-04-09 ENCOUNTER — Ambulatory Visit (HOSPITAL_COMMUNITY)
Admission: RE | Admit: 2019-04-09 | Discharge: 2019-04-09 | Disposition: A | Discharge status: Home / Self Care | Payer: 59 | Source: Ambulatory Visit | Attending: Critical Care Medicine | Admitting: Critical Care Medicine

## 2019-04-09 DIAGNOSIS — U071 COVID-19: Secondary | ICD-10-CM

## 2019-04-09 DIAGNOSIS — I2510 Atherosclerotic heart disease of native coronary artery without angina pectoris: Secondary | ICD-10-CM | POA: Diagnosis not present

## 2019-04-09 MED ORDER — ALBUTEROL SULFATE HFA 108 (90 BASE) MCG/ACT IN AERS: 2.0000 | INHALATION_SPRAY | Freq: Once | RESPIRATORY_TRACT | Status: DC | PRN

## 2019-04-09 MED ORDER — EPINEPHRINE 0.3 MG/0.3ML IJ SOAJ: 0.3000 mg | Freq: Once | INTRAMUSCULAR | Status: DC | PRN

## 2019-04-09 MED ORDER — SODIUM CHLORIDE 0.9 % IV SOLN: INTRAVENOUS | Status: DC | PRN

## 2019-04-09 MED ORDER — METHYLPREDNISOLONE SODIUM SUCC 125 MG IJ SOLR: 125.0000 mg | Freq: Once | INTRAMUSCULAR | Status: DC | PRN

## 2019-04-09 MED ORDER — DIPHENHYDRAMINE HCL 50 MG/ML IJ SOLN: 50.0000 mg | Freq: Once | INTRAMUSCULAR | Status: DC | PRN

## 2019-04-09 MED ORDER — FAMOTIDINE IN NACL 20-0.9 MG/50ML-% IV SOLN: 20.0000 mg | Freq: Once | INTRAVENOUS | Status: DC | PRN

## 2019-04-09 MED ORDER — SODIUM CHLORIDE 0.9 % IV SOLN
700.0000 mg | Freq: Once | INTRAVENOUS | Status: AC
  Administered 2019-04-09: 700 mg via INTRAVENOUS
  Filled 2019-04-09: qty 20

## 2019-04-09 NOTE — Progress Notes (Signed)
  Diagnosis: COVID-19  Physician: Dr. Joya Gaskins.  Procedure: bamlanivimab infusion Provided patient with bamlanivimab fact sheet for patients, parents and caregivers prior to infusion.  Complications: No immediate complications noted.   Discharge: Discharged home   Tamara [name] 04/09/2019

## 2019-05-20 ENCOUNTER — Ambulatory Visit: Payer: 59 | Admitting: Internal Medicine

## 2019-09-26 ENCOUNTER — Other Ambulatory Visit: Payer: Self-pay | Admitting: Internal Medicine

## 2020-04-12 ENCOUNTER — Ambulatory Visit (INDEPENDENT_AMBULATORY_CARE_PROVIDER_SITE_OTHER): Payer: 59 | Admitting: Internal Medicine

## 2020-04-12 ENCOUNTER — Encounter: Payer: Self-pay | Admitting: Internal Medicine

## 2020-04-12 ENCOUNTER — Other Ambulatory Visit: Payer: Self-pay

## 2020-04-12 VITALS — BP 94/64 | HR 64 | Ht 65.0 in | Wt 156.0 lb

## 2020-04-12 DIAGNOSIS — E785 Hyperlipidemia, unspecified: Secondary | ICD-10-CM

## 2020-04-12 DIAGNOSIS — I255 Ischemic cardiomyopathy: Secondary | ICD-10-CM | POA: Diagnosis not present

## 2020-04-12 DIAGNOSIS — I200 Unstable angina: Secondary | ICD-10-CM

## 2020-04-12 DIAGNOSIS — I2510 Atherosclerotic heart disease of native coronary artery without angina pectoris: Secondary | ICD-10-CM | POA: Diagnosis not present

## 2020-04-12 MED ORDER — TICAGRELOR 60 MG PO TABS: 60.0000 mg | ORAL_TABLET | Freq: Two times a day (BID) | ORAL | 5 refills | Status: DC

## 2020-04-12 NOTE — H&P (View-Only) (Signed)
° °Follow-up Outpatient Visit °Date: 04/12/2020 ° °Primary Care Provider: °System, Provider Not In °No address on file ° °Chief Complaint: Chest pain ° °HPI:  Tamara [name] is a 59 y.o. female with history of coronary artery disease status post PCI to the LAD and LCx/OM, ischemic cardiomyopathy with mildly reduced LVEF at the time of his STEMI in 12/2015, hyperlipidemia, and anxiety, who presents for follow-up of CAD.  I last saw Tamara [name] in 02/2019, at which time she was doing well.  We did not make any medication changes or pursue additional testing at that time. ° °Today, Tamara [name] reports that she has been feeling more chest pain over the last few weeks.  She attributes this to anxiety and stress from her job.  She initially feels a sharp pain in her upper back, which then radiates forward into her chest.  It is sometimes accompanied by dizziness and pain in the left arm.  It often resolves with relaxation, though she has needed to take aspirin and nitroglycerin (up to 2 tabs) on occasion.  The episodes are happening ~2x/week.  The pain is not exertional but seems to be triggered by stressful emails at work. ° °Tamara [name] remains compliant with her medications, including aspirin, ticagrelor, and atorvastatin.  She denies bleeding.  Home BP's are typically in the 110's/70's.  She has not felt lightheaded, even with systolic readings in the 90's. ° °-------------------------------------------------------------------------------------------------- ° °Cardiovascular History & Procedures: °Cardiovascular Problems: °· Coronary artery disease status post MI x 2 (most recently 12/2015) °· Ischemic cardiomyopathy °  °Risk Factors: °· Known coronary artery disease, hyperlipidemia °  °Cath/PCI: °· LHC/PCI (01/11/16): LMCA normal.  LAD with distal stent with mild ISR (~10%).  Hazy 50% stenosis in proximal LCx with occlusion of OM1.  Question spontaneous dissection versus plaque erosion and thrombosis of OM1.  40%  ostial and 30% distal LCx stenoses also present.  RCA normal.  Successful PCI to LCx/OM with placement of 3 Promus Premier drug-eluting stents using DK-crush technique. °  °CV Surgery: °· None °  °EP Procedures and Devices: °· None °  °Non-Invasive Evaluation(s): °· Exercise myocardial perfusion stress test (08/19/17): Abnormal, low risk stress test with small in size, mild in severity, fixed mid inferolateral and apical lateral defect that may represent subtle scar and/or artifact.  No significant ischemia.  LVEF greater than 65%.  Good exercise capacity with rare PVCs at rest and during recovery. °· TTE (01/12/16): Mildly dilated LV with normal wall thickness and basal/mid inferior hypokinesis (LVEF 45-50%).  No significant valvular disease.  Normal RV size/function.  Upper normal to mildly elevated pulmonary artery pressure.  Normal central venous pressure. ° °Recent CV Pertinent Labs: °Lab Results  °Component Value Date  ° CHOL 132 02/18/2019  ° CHOL 117 07/24/2016  ° HDL 63 02/18/2019  ° HDL 59 07/24/2016  ° LDLCALC 64 02/18/2019  ° LDLCALC 51 07/24/2016  ° TRIG 24 02/18/2019  ° CHOLHDL 2.1 02/18/2019  ° INR 1.10 01/11/2016  ° K 4.2 02/18/2019  ° K 4.3 06/09/2012  ° BUN 25 (H) 02/18/2019  ° BUN 12 06/09/2012  ° CREATININE 0.87 02/18/2019  ° CREATININE 0.71 06/09/2012  ° ° °Past medical and surgical history were reviewed and updated in EPIC. ° °Current Meds  °Medication Sig  °• aspirin EC 81 MG tablet Take 81 mg by mouth daily. FOR HEART HEALTH  °• atorvastatin (LIPITOR) 80 MG tablet Take 40 mg by mouth at bedtime. Take 0.5 tablet (40 mg) by mouth once a day.  °• BRILINTA 60 MG TABS tablet Take 1 tablet by mouth twice daily  °• buPROPion (  WELLBUTRIN) 75 MG tablet Take 75 mg by mouth 2 (two) times daily.  °• Cholecalciferol (VITAMIN D-3 PO) Take 1 tablet by mouth daily.  °• cyclobenzaprine (FLEXERIL) 10 MG tablet Take 10 mg by mouth 3 (three) times daily as needed for muscle spasms.  °• docusate sodium (STOOL  SOFTENER) 100 MG capsule Take 100 mg by mouth daily as needed for constipation.  °• escitalopram (LEXAPRO) 10 MG tablet Take 10 mg by mouth at bedtime.   °• Fluocinonide 0.1 % CREA Apply 1 application topically daily.  °• fluticasone (FLONASE) 50 MCG/ACT nasal spray Place 2 sprays into both nostrils daily as needed for allergies.  °• ibuprofen (ADVIL) 800 MG tablet Take 800 mg by mouth every 6 (six) hours as needed.   °• metoprolol tartrate (LOPRESSOR) 25 MG tablet Take 0.5 tablets (12.5 mg total) by mouth 2 (two) times daily.  °• Multiple Vitamins-Minerals (ONE-A-DAY WOMENS 50+ ADVANTAGE) TABS Take 1 tablet by mouth daily.  °• naltrexone (DEPADE) 50 MG tablet Take 25 mg by mouth daily.  °• nitroGLYCERIN (NITROSTAT) 0.4 MG SL tablet Place 0.4 mg under the tongue every 5 (five) minutes as needed for chest pain.  °• polyethylene glycol (MIRALAX / GLYCOLAX) packet Take 17 g by mouth 2 (two) times daily.  °• traZODone (DESYREL) 50 MG tablet Take 50 mg by mouth at bedtime.  °• VITAMIN E PO Take 1 capsule by mouth daily.  ° ° °Allergies: Penicillins ° °Social History  ° °Tobacco Use  °• Smoking status: Never Smoker  °• Smokeless tobacco: Never Used  °Substance Use Topics  °• Alcohol use: No  °• Drug use: No  ° ° °Family History  °Problem Relation Age of Onset  °• Diabetes Mother   °• CAD Father   °• Polycystic kidney disease Father   °• Aneurysm Sister   °• Polycystic kidney disease Brother   ° ° °Review of Systems: °A 12-system review of systems was performed and was negative except as noted in the HPI. ° °-------------------------------------------------------------------------------------------------- ° °Physical Exam: °BP 94/64 (BP Location: Left Arm, Patient Position: Sitting, Cuff Size: Normal)    Pulse 64    Ht 5' 5" (1.651 m)    Wt 156 lb (70.8 kg)    SpO2 97%    BMI 25.96 kg/m²  ° °General:  NAD. °HEENT: No conjunctival pallor or scleral icterus. Facemask in place. °Neck: Supple without lymphadenopathy,  thyromegaly, JVD, or HJR. °Lungs: Normal work of breathing. Clear to auscultation bilaterally without wheezes or crackles. °Heart: Regular rate and rhythm without murmurs, rubs, or gallops.  °Abd: Bowel sounds present. Soft, NT/ND without hepatosplenomegaly °Ext: No lower extremity edema. ° °EKG:  NSR without abnormality.  No change from prior tracing on 02/17/2019. ° °Lab Results  °Component Value Date  ° WBC 6.3 02/18/2019  ° HGB 12.5 02/18/2019  ° HCT 39.0 02/18/2019  ° MCV 85.0 02/18/2019  ° PLT 253 02/18/2019  ° ° °Lab Results  °Component Value Date  ° NA 140 02/18/2019  ° K 4.2 02/18/2019  ° CL 107 02/18/2019  ° CO2 28 02/18/2019  ° BUN 25 (H) 02/18/2019  ° CREATININE 0.87 02/18/2019  ° GLUCOSE 87 02/18/2019  ° ALT 26 02/18/2019  ° ° °Lab Results  °Component Value Date  ° CHOL 132 02/18/2019  ° HDL 63 02/18/2019  ° LDLCALC 64 02/18/2019  ° TRIG 24 02/18/2019  ° CHOLHDL 2.1 02/18/2019  ° ° °-------------------------------------------------------------------------------------------------- ° °ASSESSMENT AND PLAN: °Accelerating angina: °While pain is not exertional and seems   to be mostly related to stress, progressive nature and response to NTG is concerning for worsening coronary insufficiency, particularly with Tamara [name]'s history of MI's and multiple stents (including complex bifurcation PCI of LCx/OM in 2017).  We have discussed further testing options, including stress testing and cardiac catheterization, and have agreed to proceed with catheterization.  I have reviewed the risks, indications, and alternatives to cardiac catheterization, possible angioplasty, and stenting with the patient. Risks include but are not limited to bleeding, infection, vascular injury, stroke, myocardial infection, arrhythmia, kidney injury, radiation-related injury in the case of prolonged fluoroscopy use, emergency cardiac surgery, and death. The patient understands the risks of serious complication is 1-2 in 1000 with  diagnostic cardiac cath and 1-2% or less with angioplasty/stenting.  We will defer medication changes today.  I have advised Tamara [name] to call 911 or seek immediate medical attention if her chest pain worsens in the meantime.  Escalation of antianginal therapy is precluded at this time due to borderline low blood pressure.  We will check a CBC and CMP today in anticipation of cath. ° °Hyperlipidemia: °Continue atorvastatin 40 mg daily.  We will check a CMP and lipid panel today. ° °Ischemic cardiomyopathy: °No signs or symptoms of heart failure.  Continue low-dose metoprolol and proceed with catheterization, as detailed above. ° °Follow-up: RTC in 3 weeks. ° °Sharece Fleischhacker, MD °04/12/2020 °8:12 AM ° °

## 2020-04-12 NOTE — Progress Notes (Signed)
Follow-up Outpatient Visit Date: 04/12/2020  Primary Care Provider: System, Provider Not In No address on file  Chief Complaint: Chest pain  HPI:  [name] is a 59 y.o. female with history of coronary artery disease status post PCI to the LAD and LCx/OM,ischemic cardiomyopathy with mildly reduced LVEF at the time of his STEMI in 12/2015, hyperlipidemia, and anxiety, who presents for follow-up of CAD.  I last saw [name] in 02/2019, at which time she was doing well.  We did not make any medication changes or pursue additional testing at that time.  Today, [name] reports that she has been feeling more chest pain over the last few weeks.  She attributes this to anxiety and stress from her job.  She initially feels a sharp pain in her upper back, which then radiates forward into her chest.  It is sometimes accompanied by dizziness and pain in the left arm.  It often resolves with relaxation, though she has needed to take aspirin and nitroglycerin (up to 2 tabs) on occasion.  The episodes are happening ~2x/week.  The pain is not exertional but seems to be triggered by stressful emails at work.  [name] remains compliant with her medications, including aspirin, ticagrelor, and atorvastatin.  She denies bleeding.  Home BP's are typically in the 110's/70's.  She has not felt lightheaded, even with systolic readings in the 90's.  --------------------------------------------------------------------------------------------------  Cardiovascular History & Procedures: Cardiovascular Problems:  Coronary artery disease status post MI x 2 (most recently 12/2015)  Ischemic cardiomyopathy  Risk Factors:  Known coronary artery disease, hyperlipidemia  Cath/PCI:  LHC/PCI (01/11/16): LMCA normal. LAD with distal stent with mild ISR (~10%). Hazy 50% stenosis in proximal LCx with occlusion of OM1. Question spontaneous dissection versus plaque erosion and thrombosis of OM1. 40%  ostial and 30% distal LCx stenoses also present. RCA normal. Successful PCI to LCx/OM with placement of 3 Promus Premier drug-eluting stents using DK-crush technique.  CV Surgery:  None  EP Procedures and Devices:  None  Non-Invasive Evaluation(s):  Exercise myocardial perfusion stress test (08/19/17): Abnormal, low risk stress test with small in size, mild in severity, fixed mid inferolateral and apical lateral defect that may represent subtle scar and/or artifact. No significant ischemia. LVEF greater than 65%. Good exercise capacity with rare PVCs at rest and during recovery.  TTE (01/12/16): Mildly dilated LV with normal wall thickness and basal/mid inferior hypokinesis (LVEF 45-50%). No significant valvular disease. Normal RV size/function. Upper normal to mildly elevated pulmonary artery pressure. Normal central venous pressure.  Recent CV Pertinent Labs: Lab Results  Component Value Date   CHOL 132 02/18/2019   CHOL 117 07/24/2016   HDL 63 02/18/2019   HDL 59 07/24/2016   LDLCALC 64 02/18/2019   LDLCALC 51 07/24/2016   TRIG 24 02/18/2019   CHOLHDL 2.1 02/18/2019   INR 1.10 01/11/2016   K 4.2 02/18/2019   K 4.3 06/09/2012   BUN 25 (H) 02/18/2019   BUN 12 06/09/2012   CREATININE 0.87 02/18/2019   CREATININE 0.71 06/09/2012    Past medical and surgical history were reviewed and updated in EPIC.  Current Meds  Medication Sig   aspirin EC 81 MG tablet Take 81 mg by mouth daily. FOR HEART HEALTH   atorvastatin (LIPITOR) 80 MG tablet Take 40 mg by mouth at bedtime. Take 0.5 tablet (40 mg) by mouth once a day.   BRILINTA 60 MG TABS tablet Take 1 tablet by mouth twice daily   buPROPion (  WELLBUTRIN) 75 MG tablet Take 75 mg by mouth 2 (two) times daily.   Cholecalciferol (VITAMIN D-3 PO) Take 1 tablet by mouth daily.   cyclobenzaprine (FLEXERIL) 10 MG tablet Take 10 mg by mouth 3 (three) times daily as needed for muscle spasms.   docusate sodium (STOOL  SOFTENER) 100 MG capsule Take 100 mg by mouth daily as needed for constipation.   escitalopram (LEXAPRO) 10 MG tablet Take 10 mg by mouth at bedtime.    Fluocinonide 0.1 % CREA Apply 1 application topically daily.   fluticasone (FLONASE) 50 MCG/ACT nasal spray Place 2 sprays into both nostrils daily as needed for allergies.   ibuprofen (ADVIL) 800 MG tablet Take 800 mg by mouth every 6 (six) hours as needed.    metoprolol tartrate (LOPRESSOR) 25 MG tablet Take 0.5 tablets (12.5 mg total) by mouth 2 (two) times daily.   Multiple Vitamins-Minerals (ONE-A-DAY WOMENS 50+ ADVANTAGE) TABS Take 1 tablet by mouth daily.   naltrexone (DEPADE) 50 MG tablet Take 25 mg by mouth daily.   nitroGLYCERIN (NITROSTAT) 0.4 MG SL tablet Place 0.4 mg under the tongue every 5 (five) minutes as needed for chest pain.   polyethylene glycol (MIRALAX / GLYCOLAX) packet Take 17 g by mouth 2 (two) times daily.   traZODone (DESYREL) 50 MG tablet Take 50 mg by mouth at bedtime.   VITAMIN E PO Take 1 capsule by mouth daily.    Allergies: Penicillins  Social History   Tobacco Use   Smoking status: Never Smoker   Smokeless tobacco: Never Used  Substance Use Topics   Alcohol use: No   Drug use: No    Family History  Problem Relation Age of Onset   Diabetes Mother    CAD Father    Polycystic kidney disease Father    Aneurysm Sister    Polycystic kidney disease Brother     Review of Systems: A 12-system review of systems was performed and was negative except as noted in the HPI.  --------------------------------------------------------------------------------------------------  Physical Exam: BP 94/64 (BP Location: Left Arm, Patient Position: Sitting, Cuff Size: Normal)    Pulse 64    Ht 5\' 5"  (1.651 m)    Wt 156 lb (70.8 kg)    SpO2 97%    BMI 25.96 kg/m   General:  NAD. HEENT: No conjunctival pallor or scleral icterus. Facemask in place. Neck: Supple without lymphadenopathy,  thyromegaly, JVD, or HJR. Lungs: Normal work of breathing. Clear to auscultation bilaterally without wheezes or crackles. Heart: Regular rate and rhythm without murmurs, rubs, or gallops.  Abd: Bowel sounds present. Soft, NT/ND without hepatosplenomegaly Ext: No lower extremity edema.  EKG:  NSR without abnormality.  No change from prior tracing on 02/17/2019.  Lab Results  Component Value Date   WBC 6.3 02/18/2019   HGB 12.5 02/18/2019   HCT 39.0 02/18/2019   MCV 85.0 02/18/2019   PLT 253 02/18/2019    Lab Results  Component Value Date   NA 140 02/18/2019   K 4.2 02/18/2019   CL 107 02/18/2019   CO2 28 02/18/2019   BUN 25 (H) 02/18/2019   CREATININE 0.87 02/18/2019   GLUCOSE 87 02/18/2019   ALT 26 02/18/2019    Lab Results  Component Value Date   CHOL 132 02/18/2019   HDL 63 02/18/2019   LDLCALC 64 02/18/2019   TRIG 24 02/18/2019   CHOLHDL 2.1 02/18/2019    --------------------------------------------------------------------------------------------------  ASSESSMENT AND PLAN: Accelerating angina: While pain is not exertional and seems  to be mostly related to stress, progressive nature and response to NTG is concerning for worsening coronary insufficiency, particularly with [name]'s history of MI's and multiple stents (including complex bifurcation PCI of LCx/OM in 2017).  We have discussed further testing options, including stress testing and cardiac catheterization, and have agreed to proceed with catheterization.  I have reviewed the risks, indications, and alternatives to cardiac catheterization, possible angioplasty, and stenting with the patient. Risks include but are not limited to bleeding, infection, vascular injury, stroke, myocardial infection, arrhythmia, kidney injury, radiation-related injury in the case of prolonged fluoroscopy use, emergency cardiac surgery, and death. The patient understands the risks of serious complication is 1-2 in 1000 with  diagnostic cardiac cath and 1-2% or less with angioplasty/stenting.  We will defer medication changes today.  I have advised [name] to call 911 or seek immediate medical attention if her chest pain worsens in the meantime.  Escalation of antianginal therapy is precluded at this time due to borderline low blood pressure.  We will check a CBC and CMP today in anticipation of cath.  Hyperlipidemia: Continue atorvastatin 40 mg daily.  We will check a CMP and lipid panel today.  Ischemic cardiomyopathy: No signs or symptoms of heart failure.  Continue low-dose metoprolol and proceed with catheterization, as detailed above.  Follow-up: RTC in 3 weeks.  [name], MD 04/12/2020 8:12 AM

## 2020-04-12 NOTE — Patient Instructions (Addendum)
Medication Instructions:  Your physician recommends that you continue on your current medications as directed. Please refer to the Current Medication list given to you today.  *If you need a refill on your cardiac medications before your next appointment, please call your pharmacy*  Lab Work: TODAY - CBC, CMP, LIPID.  COVID PRE- TEST: You will need a COVID TEST prior to the procedure:  LOCATION: [facility] Testing site.  DATE/TIME:  Monday, April 17, 2020 anytime between 8 am and 1 pm.   If you have labs (blood work) drawn today and your tests are completely normal, you will receive your results only by: Marland Kitchen MyChart Message (if you have MyChart) OR . A paper copy in the mail If you have any lab test that is abnormal or we need to change your treatment, we will call you to review the results.  Testing/Procedures:   You are scheduled for a LEFT Cardiac Catheterization on Tuesday, December 7 with Dr. Cristal Deer End.  1. Please arrive at the [facility] at 8:30 AM (This time is one hour before your procedure to ensure your preparation). Free valet parking service is available.   Special note: Every effort is made to have your procedure done on time. Please understand that emergencies sometimes delay scheduled procedures.  2. Diet: Do not eat solid foods after midnight.  The patient may have clear liquids until 5am upon the day of the procedure.  3. Labs: You will need to have blood drawn on TODAY. You do not need to be fasting.  4. Medication instructions in preparation for your procedure:   Contrast Allergy: No  On the morning of your procedure, take your Brilinta/Ticagrelor and any morning medicines NOT listed above.  You may use sips of water.  5. Plan for one night stay--bring personal belongings. 6. Bring a current list of your medications and current insurance cards. 7. You MUST have a responsible person to drive you home. 8. Someone MUST be  with you the first 24 hours after you arrive home or your discharge will be delayed. 9. Please wear clothes that are easy to get on and off and wear slip-on shoes.  Thank you for allowing [place] to care for you!   -- [place] [facility]    Follow-Up: At [facility], you and your health needs are our priority.  As part of our continuing mission to provide you with exceptional heart care, we have created designated Provider Care Teams.  These Care Teams include your primary Cardiologist (physician) and Advanced Practice Providers (APPs -  Physician Assistants and Nurse Practitioners) who all work together to provide you with the care you need, when you need it.  We recommend signing up for the patient portal called "MyChart".  Sign up information is provided on this After Visit Summary.  MyChart is used to connect with patients for Virtual Visits (Telemedicine).  Patients are able to view lab/test results, encounter notes, upcoming appointments, etc.  Non-urgent messages can be sent to your provider as well.   To learn more about what you can do with MyChart, go to [email or web address].    Your next appointment:   3 week(s)  The format for your next appointment:   In Person  Provider:   You may see DR Cristal Deer END or one of the following Advanced Practice Providers on your designated Care Team:    Nicolasa Ducking, NP  Eula Listen, PA-C  Marisue Ivan, PA-C  Cadence Fransico Michael,  PA-C  Gillian Shields, NP    Coronary Angiogram With Stent Coronary angiogram with stent placement is a procedure to widen or open a narrow blood vessel of the heart (coronary artery). Arteries may become blocked by cholesterol buildup (plaques) in the lining of the artery wall. When a coronary artery becomes partially blocked, blood flow to that area decreases. This may lead to chest pain or a heart attack (myocardial infarction). A stent is a small piece of metal that looks like  mesh or spring. Stent placement may be done as treatment after a heart attack, or to prevent a heart attack if a blocked artery is found by a coronary angiogram. Let your health care provider know about:  Any allergies you have, including allergies to medicines or contrast dye.  All medicines you are taking, including vitamins, herbs, eye drops, creams, and over-the-counter medicines.  Any problems you or family members have had with anesthetic medicines.  Any blood disorders you have.  Any surgeries you have had.  Any medical conditions you have, including kidney problems or kidney failure.  Whether you are pregnant or may be pregnant.  Whether you are breastfeeding. What are the risks? Generally, this is a safe procedure. However, serious problems may occur, including:  Damage to nearby structures or organs, such as the heart, blood vessels, or kidneys.  A return of blockage.  Bleeding, infection, or bruising at the insertion site.  A collection of blood under the skin (hematoma) at the insertion site.  A blood clot in another part of the body.  Allergic reaction to medicines or dyes.  Bleeding into the abdomen (retroperitoneal bleeding).  Stroke (rare).  Heart attack (rare). What happens before the procedure? Staying hydrated Follow instructions from your health care provider about hydration, which may include:  Up to 2 hours before the procedure - you may continue to drink clear liquids, such as water, clear fruit juice, black coffee, and plain tea.  Eating and drinking restrictions Follow instructions from your health care provider about eating and drinking, which may include:  8 hours before the procedure - stop eating heavy meals or foods, such as meat, fried foods, or fatty foods.  6 hours before the procedure - stop eating light meals or foods, such as toast or cereal.  2 hours before the procedure - stop drinking clear liquids. Medicines Ask your health  care provider about:  Changing or stopping your regular medicines. This is especially important if you are taking diabetes medicines or blood thinners.  Taking medicines such as aspirin and ibuprofen. These medicines can thin your blood. Do not take these medicines unless your health care provider tells you to take them. ? Generally, aspirin is recommended before a thin tube, called a catheter, is passed through a blood vessel and inserted into the heart (cardiac catheterization).  Taking over-the-counter medicines, vitamins, herbs, and supplements. General instructions  Do not use any products that contain nicotine or tobacco for at least 4 weeks before the procedure. These products include cigarettes, e-cigarettes, and chewing tobacco. If you need help quitting, ask your health care provider.  Plan to have someone take you home from the hospital or clinic.  If you will be going home right after the procedure, plan to have someone with you for 24 hours.  You may have tests and imaging procedures.  Ask your health care provider: ? How your insertion site will be marked. Ask which artery will be used for the procedure. ? What steps will  be taken to help prevent infection. These may include:  Removing hair at the insertion site.  Washing skin with a germ-killing soap.  Taking antibiotic medicine. What happens during the procedure?   An IV will be inserted into one of your veins.  Electrodes may be placed on your chest to monitor your heart rate during the procedure.  You will be given one or more of the following: ? A medicine to help you relax (sedative). ? A medicine to numb the area (local anesthetic) for catheter insertion.  A small incision will be made for catheter insertion.  The catheter will be inserted into an artery using a guide wire. The location may be in your groin, your wrist, or the fold of your arm (near your elbow).  An X-ray procedure (fluoroscopy) will be  used to help guide the catheter to the opening of the heart arteries.  A dye will be injected into the catheter. X-rays will be taken. The dye helps to show where any narrowing or blockages are located in the arteries.  Tell your health care provider if you have chest pain or trouble breathing.  A tiny wire will be guided to the blocked spot, and a balloon will be inflated to make the artery wider.  The stent will be expanded to crush the plaques into the wall of the vessel. The stent will hold the area open and improve the blood flow. Most stents have a drug coating to reduce the risk of the stent narrowing over time.  The artery may be made wider using a drill, laser, or other tools that remove plaques.  The catheter will be removed when the blood flow improves. The stent will stay where it was placed, and the lining of the artery will grow over it.  A bandage (dressing) will be placed on the insertion site. Pressure will be applied to stop bleeding.  The IV will be removed. This procedure may vary among health care providers and hospitals. What happens after the procedure?  Your blood pressure, heart rate, breathing rate, and blood oxygen level will be monitored until you leave the hospital or clinic.  If the procedure is done through the leg, you will lie flat in bed for a few hours or for as long as told by your health care provider. You will be instructed not to bend or cross your legs.  The insertion site and the pulse in your foot or wrist will be checked often.  You may have more blood tests, X-rays, and a test that records the electrical activity of your heart (electrocardiogram, or ECG).  Do not drive for 24 hours if you were given a sedative during your procedure. Summary  Coronary angiogram with stent placement is a procedure to widen or open a narrowed coronary artery. This is done to treat heart problems.  Before the procedure, let your health care provider know about  all the medical conditions and surgeries you have or have had.  This is a safe procedure. However, some problems may occur, including damage to nearby structures or organs, bleeding, blood clots, or allergies.  Follow your health care provider's instructions about eating, drinking, medicines, and other lifestyle changes, such as quitting tobacco use before the procedure. This information is not intended to replace advice given to you by your health care provider. Make sure you discuss any questions you have with your health care provider. Document Revised: 11/18/2018 Document Reviewed: 11/18/2018 Elsevier Patient Education  2020 [employer].

## 2020-04-13 LAB — CBC
Hematocrit: 41.2 % (ref 34.0–46.6)
Hemoglobin: 12.9 g/dL (ref 11.1–15.9)
MCH: 26.4 pg — ABNORMAL LOW (ref 26.6–33.0)
MCHC: 31.3 g/dL — ABNORMAL LOW (ref 31.5–35.7)
MCV: 84 fL (ref 79–97)
Platelets: 259 10*3/uL (ref 150–450)
RBC: 4.89 x10E6/uL (ref 3.77–5.28)
RDW: 12.5 % (ref 11.7–15.4)
WBC: 3.4 10*3/uL (ref 3.4–10.8)

## 2020-04-13 LAB — COMPREHENSIVE METABOLIC PANEL
ALT: 19 IU/L (ref 0–32)
AST: 25 IU/L (ref 0–40)
Albumin/Globulin Ratio: 1.5 (ref 1.2–2.2)
Albumin: 4 g/dL (ref 3.8–4.9)
Alkaline Phosphatase: 92 IU/L (ref 44–121)
BUN/Creatinine Ratio: 13 (ref 9–23)
BUN: 12 mg/dL (ref 6–24)
Bilirubin Total: 0.5 mg/dL (ref 0.0–1.2)
CO2: 26 mmol/L (ref 20–29)
Calcium: 9.4 mg/dL (ref 8.7–10.2)
Chloride: 100 mmol/L (ref 96–106)
Creatinine, Ser: 0.9 mg/dL (ref 0.57–1.00)
GFR calc Af Amer: 82 mL/min/{1.73_m2} (ref >59)
GFR calc non Af Amer: 71 mL/min/{1.73_m2} (ref >59)
Globulin, Total: 2.6 g/dL (ref 1.5–4.5)
Glucose: 82 mg/dL (ref 65–99)
Potassium: 4.6 mmol/L (ref 3.5–5.2)
Sodium: 138 mmol/L (ref 134–144)
Total Protein: 6.6 g/dL (ref 6.0–8.5)

## 2020-04-13 LAB — LIPID PANEL
Chol/HDL Ratio: 2 ratio (ref 0.0–4.4)
Cholesterol, Total: 142 mg/dL (ref 100–199)
HDL: 70 mg/dL (ref >39)
LDL Chol Calc (NIH): 64 mg/dL (ref 0–99)
Triglycerides: 32 mg/dL (ref 0–149)
VLDL Cholesterol Cal: 8 mg/dL (ref 5–40)

## 2020-04-17 ENCOUNTER — Other Ambulatory Visit: Payer: Self-pay

## 2020-04-17 ENCOUNTER — Other Ambulatory Visit
Admission: RE | Admit: 2020-04-17 | Discharge: 2020-04-17 | Disposition: A | Discharge status: Home / Self Care | Payer: 59 | Source: Ambulatory Visit | Attending: Internal Medicine | Admitting: Internal Medicine

## 2020-04-17 DIAGNOSIS — Z01812 Encounter for preprocedural laboratory examination: Secondary | ICD-10-CM | POA: Insufficient documentation

## 2020-04-17 DIAGNOSIS — Z20822 Contact with and (suspected) exposure to covid-19: Secondary | ICD-10-CM | POA: Diagnosis not present

## 2020-04-18 ENCOUNTER — Ambulatory Visit
Admission: RE | Admit: 2020-04-18 | Discharge: 2020-04-18 | Disposition: A | Discharge status: Home / Self Care | Payer: 59 | Attending: Internal Medicine | Admitting: Internal Medicine

## 2020-04-18 ENCOUNTER — Encounter
Admission: RE | Disposition: A | Discharge status: Home / Self Care | Payer: Self-pay | Source: Home / Self Care | Attending: Internal Medicine

## 2020-04-18 ENCOUNTER — Other Ambulatory Visit: Payer: Self-pay

## 2020-04-18 DIAGNOSIS — Z8249 Family history of ischemic heart disease and other diseases of the circulatory system: Secondary | ICD-10-CM | POA: Insufficient documentation

## 2020-04-18 DIAGNOSIS — Y838 Other surgical procedures as the cause of abnormal reaction of the patient, or of later complication, without mention of misadventure at the time of the procedure: Secondary | ICD-10-CM | POA: Insufficient documentation

## 2020-04-18 DIAGNOSIS — I255 Ischemic cardiomyopathy: Secondary | ICD-10-CM | POA: Diagnosis not present

## 2020-04-18 DIAGNOSIS — Z880 Allergy status to penicillin: Secondary | ICD-10-CM | POA: Insufficient documentation

## 2020-04-18 DIAGNOSIS — T82855A Stenosis of coronary artery stent, initial encounter: Secondary | ICD-10-CM | POA: Diagnosis not present

## 2020-04-18 DIAGNOSIS — E785 Hyperlipidemia, unspecified: Secondary | ICD-10-CM | POA: Insufficient documentation

## 2020-04-18 DIAGNOSIS — Z7982 Long term (current) use of aspirin: Secondary | ICD-10-CM | POA: Diagnosis not present

## 2020-04-18 DIAGNOSIS — I200 Unstable angina: Secondary | ICD-10-CM | POA: Diagnosis not present

## 2020-04-18 DIAGNOSIS — I25110 Atherosclerotic heart disease of native coronary artery with unstable angina pectoris: Secondary | ICD-10-CM | POA: Diagnosis not present

## 2020-04-18 DIAGNOSIS — I252 Old myocardial infarction: Secondary | ICD-10-CM | POA: Insufficient documentation

## 2020-04-18 DIAGNOSIS — Z79899 Other long term (current) drug therapy: Secondary | ICD-10-CM | POA: Diagnosis not present

## 2020-04-18 DIAGNOSIS — Z955 Presence of coronary angioplasty implant and graft: Secondary | ICD-10-CM | POA: Diagnosis not present

## 2020-04-18 HISTORY — PX: LEFT HEART CATH AND CORONARY ANGIOGRAPHY: CATH118249

## 2020-04-18 LAB — SARS CORONAVIRUS 2 (TAT 6-24 HRS): SARS Coronavirus 2: NEGATIVE

## 2020-04-18 SURGERY — LEFT HEART CATH AND CORONARY ANGIOGRAPHY
Anesthesia: Moderate Sedation | Laterality: Left

## 2020-04-18 MED ORDER — HEPARIN SODIUM (PORCINE) 1000 UNIT/ML IJ SOLN
INTRAMUSCULAR | Status: AC
  Filled 2020-04-18: qty 1

## 2020-04-18 MED ORDER — SODIUM CHLORIDE 0.9 % WEIGHT BASED INFUSION: 1.0000 mL/kg/h | INTRAVENOUS | Status: DC

## 2020-04-18 MED ORDER — VERAPAMIL HCL 2.5 MG/ML IV SOLN
INTRAVENOUS | Status: DC | PRN
  Administered 2020-04-18: 2.5 mg via INTRAVENOUS

## 2020-04-18 MED ORDER — LIDOCAINE HCL (PF) 1 % IJ SOLN
INTRAMUSCULAR | Status: DC | PRN
  Administered 2020-04-18: 2 mL

## 2020-04-18 MED ORDER — SODIUM CHLORIDE 0.9% FLUSH: 3.0000 mL | INTRAVENOUS | Status: DC | PRN

## 2020-04-18 MED ORDER — ASPIRIN 81 MG PO CHEW: 81.0000 mg | CHEWABLE_TABLET | ORAL | Status: DC

## 2020-04-18 MED ORDER — FENTANYL CITRATE (PF) 100 MCG/2ML IJ SOLN
INTRAMUSCULAR | Status: AC
  Filled 2020-04-18: qty 2

## 2020-04-18 MED ORDER — HEPARIN (PORCINE) IN NACL 2000-0.9 UNIT/L-% IV SOLN
INTRAVENOUS | Status: DC | PRN
  Administered 2020-04-18: 3500 mL

## 2020-04-18 MED ORDER — MIDAZOLAM HCL 2 MG/2ML IJ SOLN
INTRAMUSCULAR | Status: AC
  Filled 2020-04-18: qty 2

## 2020-04-18 MED ORDER — HEPARIN (PORCINE) IN NACL 1000-0.9 UT/500ML-% IV SOLN
INTRAVENOUS | Status: AC
  Filled 2020-04-18: qty 1000

## 2020-04-18 MED ORDER — SODIUM CHLORIDE 0.9 % WEIGHT BASED INFUSION: 3.0000 mL/kg/h | INTRAVENOUS | Status: AC

## 2020-04-18 MED ORDER — MIDAZOLAM HCL 2 MG/2ML IJ SOLN
INTRAMUSCULAR | Status: DC | PRN
  Administered 2020-04-18: 1 mg via INTRAVENOUS

## 2020-04-18 MED ORDER — SODIUM CHLORIDE 0.9 % IV SOLN: 250.0000 mL | INTRAVENOUS | Status: DC | PRN

## 2020-04-18 MED ORDER — ISOSORBIDE MONONITRATE ER 30 MG PO TB24: 15.0000 mg | ORAL_TABLET | Freq: Every day | ORAL | 5 refills | Status: DC

## 2020-04-18 MED ORDER — VERAPAMIL HCL 2.5 MG/ML IV SOLN
INTRAVENOUS | Status: AC
  Filled 2020-04-18: qty 2

## 2020-04-18 MED ORDER — LIDOCAINE HCL (PF) 1 % IJ SOLN
INTRAMUSCULAR | Status: AC
  Filled 2020-04-18: qty 30

## 2020-04-18 MED ORDER — HEPARIN (PORCINE) IN NACL 1000-0.9 UT/500ML-% IV SOLN
INTRAVENOUS | Status: DC | PRN
  Administered 2020-04-18: 500 mL

## 2020-04-18 MED ORDER — FENTANYL CITRATE (PF) 100 MCG/2ML IJ SOLN
INTRAMUSCULAR | Status: DC | PRN
  Administered 2020-04-18: 25 ug via INTRAVENOUS

## 2020-04-18 MED ORDER — IOHEXOL 300 MG/ML  SOLN
INTRAMUSCULAR | Status: DC | PRN
  Administered 2020-04-18: 65 mL

## 2020-04-18 MED ORDER — SODIUM CHLORIDE 0.9% FLUSH: 3.0000 mL | Freq: Two times a day (BID) | INTRAVENOUS | Status: DC

## 2020-04-18 SURGICAL SUPPLY — 8 items
CATH 5F 110X4 TIG (CATHETERS) ×3 IMPLANT
CATH INFINITI 5FR ANG PIGTAIL (CATHETERS) ×3 IMPLANT
DEVICE RAD TR BAND REGULAR (VASCULAR PRODUCTS) ×3 IMPLANT
GLIDESHEATH SLEND SS 6F .021 (SHEATH) ×3 IMPLANT
GUIDEWIRE INQWIRE 1.5J.035X260 (WIRE) ×1 IMPLANT
INQWIRE 1.5J .035X260CM (WIRE) ×3
KIT MANI 3VAL PERCEP (MISCELLANEOUS) ×3 IMPLANT
PACK CARDIAC CATH (CUSTOM PROCEDURE TRAY) ×3 IMPLANT

## 2020-04-18 NOTE — Brief Op Note (Signed)
BRIEF CARDIAC CATHETERIZATION NOTE  04/18/2020  10:18 AM  PATIENT:  Tamara [name]  60 y.o. female  PRE-OPERATIVE DIAGNOSIS:  Accelerating angina  POST-OPERATIVE DIAGNOSIS:  Accelerating angina  PROCEDURE:  Procedure(s): LEFT HEART CATH AND CORONARY ANGIOGRAPHY (Left)  SURGEON:  Surgeon(s) and Role:    [name], MD - Primary  FINDINGS: 1. Patent distal LAD and LCx/OM stents with mild in-stent restenosis. 2. No significant de-novo coronary artery disease. 3. Low normal LVEF (50-55%) with mildly elevated filling pressure.  RECOMMENDATIONS: 1. Medical therapy; start isosorbide mononitrate 15 mg daily. 2. Aggressive secondary prevention.  [name], MD [facility]

## 2020-04-18 NOTE — Discharge Instructions (Signed)
Moderate Conscious Sedation, Adult Sedation is the use of medicines to promote relaxation and relieve discomfort and anxiety. Moderate conscious sedation is a type of sedation. Under moderate conscious sedation, you are less alert than normal, but you are still able to respond to instructions, touch, or both. Moderate conscious sedation is used during short medical and dental procedures. It is milder than deep sedation, which is a type of sedation under which you cannot be easily woken up. It is also milder than general anesthesia, which is the use of medicines to make you unconscious. Moderate conscious sedation allows you to return to your regular activities sooner. Tell a health care provider about:  Any allergies you have.  All medicines you are taking, including vitamins, herbs, eye drops, creams, and over-the-counter medicines.  Use of steroids (by mouth or creams).  Any problems you or family members have had with sedatives and anesthetic medicines.  Any blood disorders you have.  Any surgeries you have had.  Any medical conditions you have, such as sleep apnea.  Whether you are pregnant or may be pregnant.  Any use of cigarettes, alcohol, marijuana, or street drugs. What are the risks? Generally, this is a safe procedure. However, problems may occur, including:  Getting too much medicine (oversedation).  Nausea.  Allergic reaction to medicines.  Trouble breathing. If this happens, a breathing tube may be used to help with breathing. It will be removed when you are awake and breathing on your own.  Heart trouble.  Lung trouble. What happens before the procedure? Staying hydrated Follow instructions from your health care provider about hydration, which may include:  Up to 2 hours before the procedure - you may continue to drink clear liquids, such as water, clear fruit juice, black coffee, and plain tea. Eating and drinking restrictions Follow instructions from your  health care provider about eating and drinking, which may include:  8 hours before the procedure - stop eating heavy meals or foods such as meat, fried foods, or fatty foods.  6 hours before the procedure - stop eating light meals or foods, such as toast or cereal.  6 hours before the procedure - stop drinking milk or drinks that contain milk.  2 hours before the procedure - stop drinking clear liquids. Medicine Ask your health care provider about:  Changing or stopping your regular medicines. This is especially important if you are taking diabetes medicines or blood thinners.  Taking medicines such as aspirin and ibuprofen. These medicines can thin your blood. Do not take these medicines before your procedure if your health care provider instructs you not to.  Tests and exams  You will have a physical exam.  You may have blood tests done to show: ? How well your kidneys and liver are working. ? How well your blood can clot. General instructions  Plan to have someone take you home from the hospital or clinic.  If you will be going home right after the procedure, plan to have someone with you for 24 hours. What happens during the procedure?  An IV tube will be inserted into one of your veins.  Medicine to help you relax (sedative) will be given through the IV tube.  The medical or dental procedure will be performed. What happens after the procedure?  Your blood pressure, heart rate, breathing rate, and blood oxygen level will be monitored often until the medicines you were given have worn off.  Do not drive for 24 hours. This information is not   intended to replace advice given to you by your health care provider. Make sure you discuss any questions you have with your health care provider. Document Revised: 04/11/2017 Document Reviewed: 08/19/2015 Elsevier Patient Education  2020 [employer] Radial Site Care  This sheet gives you information about how to care for  yourself after your procedure. Your health care provider may also give you more specific instructions. If you have problems or questions, contact your health care provider. What can I expect after the procedure? After the procedure, it is common to have:  Bruising and tenderness at the catheter insertion area. Follow these instructions at home: Medicines  Take over-the-counter and prescription medicines only as told by your health care provider. Insertion site care  Follow instructions from your health care provider about how to take care of your insertion site. Make sure you: ? Wash your hands with soap and water before you change your bandage (dressing). If soap and water are not available, use hand sanitizer. ? Change your dressing as told by your health care provider. ? Leave stitches (sutures), skin glue, or adhesive strips in place. These skin closures may need to stay in place for 2 weeks or longer. If adhesive strip edges start to loosen and curl up, you may trim the loose edges. Do not remove adhesive strips completely unless your health care provider tells you to do that.  Check your insertion site every day for signs of infection. Check for: ? Redness, swelling, or pain. ? Fluid or blood. ? Pus or a bad smell. ? Warmth.  Do not take baths, swim, or use a hot tub until your health care provider approves.  You may shower 24-48 hours after the procedure, or as directed by your health care provider. ? Remove the dressing and gently wash the site with plain soap and water. ? Pat the area dry with a clean towel. ? Do not rub the site. That could cause bleeding.  Do not apply powder or lotion to the site. Activity   For 24 hours after the procedure, or as directed by your health care provider: ? Do not flex or bend the affected arm. ? Do not push or pull heavy objects with the affected arm. ? Do not drive yourself home from the hospital or clinic. You may drive 24 hours after  the procedure unless your health care provider tells you not to. ? Do not operate machinery or power tools.  Do not lift anything that is heavier than 10 lb (4.5 kg), or the limit that you are told, until your health care provider says that it is safe.  Ask your health care provider when it is okay to: ? Return to work or school. ? Resume usual physical activities or sports. ? Resume sexual activity. General instructions  If the catheter site starts to bleed, raise your arm and put firm pressure on the site. If the bleeding does not stop, get help right away. This is a medical emergency.  If you went home on the same day as your procedure, a responsible adult should be with you for the first 24 hours after you arrive home.  Keep all follow-up visits as told by your health care provider. This is important. Contact a health care provider if:  You have a fever.  You have redness, swelling, or yellow drainage around your insertion site. Get help right away if:  You have unusual pain at the radial site.  The catheter insertion area swells very  fast.  The insertion area is bleeding, and the bleeding does not stop when you hold steady pressure on the area.  Your arm or hand becomes pale, cool, tingly, or numb. These symptoms may represent a serious problem that is an emergency. Do not wait to see if the symptoms will go away. Get medical help right away. Call your local emergency services (911 in the [place] Do not drive yourself to the hospital. Summary  After the procedure, it is common to have bruising and tenderness at the site.  Follow instructions from your health care provider about how to take care of your radial site wound. Check the wound every day for signs of infection.  Do not lift anything that is heavier than 10 lb (4.5 kg), or the limit that you are told, until your health care provider says that it is safe. This information is not intended to replace advice given to you  by your health care provider. Make sure you discuss any questions you have with your health care provider. Document Revised: 06/04/2017 Document Reviewed: 06/04/2017 Elsevier Patient Education  2020 [employer].

## 2020-04-18 NOTE — Interval H&P Note (Signed)
History and Physical Interval Note:  04/18/2020 8:59 AM  Tamara [name]  has presented today for surgery, with the diagnosis of accelerating angina.  The various methods of treatment have been discussed with the patient and family. After consideration of risks, benefits and other options for treatment, the patient has consented to  Procedure(s): LEFT HEART CATH AND CORONARY ANGIOGRAPHY (Left) as a surgical intervention.  The patient's history has been reviewed, patient examined, no change in status, stable for surgery.  I have reviewed the patient's chart and labs.  Questions were answered to the patient's satisfaction.    Cath Lab Visit (complete for each Cath Lab visit)  Clinical Evaluation Leading to the Procedure:   ACS: No.  Non-ACS:    Anginal Classification: CCS IV  Anti-ischemic medical therapy: Minimal Therapy (1 class of medications)  Non-Invasive Test Results: No non-invasive testing performed  Prior CABG: No previous CABG  Tamara [name]

## 2020-04-19 ENCOUNTER — Encounter: Payer: Self-pay | Admitting: Internal Medicine

## 2020-05-10 ENCOUNTER — Ambulatory Visit: Payer: 59 | Admitting: Internal Medicine

## 2020-11-16 ENCOUNTER — Telehealth: Payer: Self-pay | Admitting: Internal Medicine

## 2020-11-16 MED ORDER — TICAGRELOR 60 MG PO TABS
60.0000 mg | ORAL_TABLET | Freq: Two times a day (BID) | ORAL | 0 refills | Status: DC
Start: 2020-11-16 — End: 2021-01-05

## 2020-11-16 NOTE — Telephone Encounter (Signed)
Requested Prescriptions   Signed Prescriptions Disp Refills   ticagrelor (BRILINTA) 60 MG TABS tablet 60 tablet 0    Sig: Take 1 tablet (60 mg total) by mouth 2 (two) times daily.    Authorizing Provider: END, CHRISTOPHER    Ordering User: Thayer Headings, Dabria Wadas L

## 2020-11-16 NOTE — Telephone Encounter (Signed)
*  STAT* If patient is at the pharmacy, call can be transferred to refill team.   1. Which medications need to be refilled? (please list name of each medication and dose if known) Brilinta  2. Which pharmacy/location (including street and city if local pharmacy) is medication to be sent to? [facility] on [address]  3. Do they need a 30 day or 90 day supply? 30

## 2021-01-05 ENCOUNTER — Other Ambulatory Visit: Payer: Self-pay

## 2021-01-05 ENCOUNTER — Encounter: Payer: Self-pay | Admitting: Internal Medicine

## 2021-01-05 ENCOUNTER — Ambulatory Visit: Payer: 59 | Admitting: Internal Medicine

## 2021-01-05 VITALS — BP 100/70 | HR 61 | Ht 64.0 in | Wt 155.0 lb

## 2021-01-05 DIAGNOSIS — I4510 Unspecified right bundle-branch block: Secondary | ICD-10-CM | POA: Insufficient documentation

## 2021-01-05 DIAGNOSIS — E785 Hyperlipidemia, unspecified: Secondary | ICD-10-CM

## 2021-01-05 DIAGNOSIS — I2510 Atherosclerotic heart disease of native coronary artery without angina pectoris: Secondary | ICD-10-CM | POA: Diagnosis not present

## 2021-01-05 MED ORDER — TICAGRELOR 60 MG PO TABS
60.0000 mg | ORAL_TABLET | Freq: Two times a day (BID) | ORAL | 0 refills | Status: DC
Start: 2021-01-05 — End: 2021-03-02

## 2021-01-05 NOTE — Progress Notes (Signed)
Follow-up Outpatient Visit Date: 01/05/2021  Primary Care Provider: System, Provider Not In No address on file  Chief Complaint: Follow-up coronary artery disease  HPI:  Tamara [name] is a 60 y.o. female with history of coronary artery disease status post PCI to the LAD and LCx/OM, ischemic cardiomyopathy with mildly reduced LVEF at the time of his STEMI in 12/2015, hyperlipidemia, and anxiety, who presents for follow-up of coronary artery disease.  I last saw her in 04/2020, at which time Tamara [name] reported increasing chest pain over the course of several weeks.  She subsequently underwent cardiac catheterization, which showed patent LAD and LCx/OM stents with only mild in-stent restenosis.  No significant CAD was observed.  Isosorbide mononitrate 15 mg daily was added.  She has not followed up since.  Today, Tamara [name] reports that she is feeling well without further chest pain, shortness of breath, palpitations, lightheadedness, or edema.  She went up stopping isosorbide mononitrate as she felt like she no longer needed it due to absence of chest pain.  She is otherwise tolerating her medications well.  She is trying to walk some but is frustrated by her inability to lose weight.  She believes that her ongoing menopause may be contributing to this.  --------------------------------------------------------------------------------------------------  Cardiovascular History & Procedures: Cardiovascular Problems: Coronary artery disease status post MI x 2 (most recently 12/2015) Ischemic cardiomyopathy   Risk Factors: Known coronary artery disease, hyperlipidemia   Cath/PCI: LHC (04/18/2020): LMCA normal.  LAD with patent distal stent with 10% ISR.  LCx with bifurcation stenting of LCx and OM1 with up to 20% ISR.  Normal dominant RCA.  LVEF 50-55% with mildly elevated filling pressure (LVEDP 15-20 mmHg). LHC/PCI (01/11/16): LMCA normal.  LAD with distal stent with mild ISR (~10%).  Hazy  50% stenosis in proximal LCx with occlusion of OM1.  Question spontaneous dissection versus plaque erosion and thrombosis of OM1.  40% ostial and 30% distal LCx stenoses also present.  RCA normal.  Successful PCI to LCx/OM with placement of 3 Promus Premier drug-eluting stents using DK-crush technique.   CV Surgery: None   EP Procedures and Devices: None   Non-Invasive Evaluation(s): Exercise myocardial perfusion stress test (08/19/17): Abnormal, low risk stress test with small in size, mild in severity, fixed mid inferolateral and apical lateral defect that may represent subtle scar and/or artifact.  No significant ischemia.  LVEF greater than 65%.  Good exercise capacity with rare PVCs at rest and during recovery. TTE (01/12/16): Mildly dilated LV with normal wall thickness and basal/mid inferior hypokinesis (LVEF 45-50%).  No significant valvular disease.  Normal RV size/function.  Upper normal to mildly elevated pulmonary artery pressure.  Normal central venous pressure.  Recent CV Pertinent Labs: Lab Results  Component Value Date   CHOL 142 04/12/2020   HDL 70 04/12/2020   LDLCALC 64 04/12/2020   TRIG 32 04/12/2020   CHOLHDL 2.0 04/12/2020   CHOLHDL 2.1 02/18/2019   INR 1.10 01/11/2016   K 4.6 04/12/2020   K 4.3 06/09/2012   BUN 12 04/12/2020   BUN 12 06/09/2012   CREATININE 0.90 04/12/2020   CREATININE 0.71 06/09/2012    Past medical and surgical history were reviewed and updated in EPIC.  Current Meds  Medication Sig   aspirin EC 81 MG tablet Take 81 mg by mouth daily. FOR HEART HEALTH   atorvastatin (LIPITOR) 40 MG tablet Take 40 mg by mouth daily at 6 PM.   buPROPion (WELLBUTRIN XL) 150 MG 24 hr tablet  Take 150 mg by mouth daily.   Cholecalciferol (VITAMIN D3) 50 MCG (2000 UT) TABS Take 2,000 Units by mouth daily.   cyclobenzaprine (FLEXERIL) 10 MG tablet Take 10 mg by mouth 3 (three) times daily as needed for muscle spasms.   docusate sodium (COLACE) 100 MG capsule Take  100 mg by mouth daily.   fluticasone (FLONASE) 50 MCG/ACT nasal spray Place 2 sprays into both nostrils daily as needed for allergies.   gabapentin (NEURONTIN) 300 MG capsule Take 300 mg by mouth daily as needed.   ibuprofen (ADVIL) 800 MG tablet Take 800 mg by mouth every 6 (six) hours as needed (pain).    ibuprofen (ADVIL) 800 MG tablet Take 800 mg by mouth every 6 (six) hours as needed.   isosorbide mononitrate (IMDUR) 30 MG 24 hr tablet Take 0.5 tablets (15 mg total) by mouth daily.   metoprolol tartrate (LOPRESSOR) 25 MG tablet Take 0.5 tablets (12.5 mg total) by mouth 2 (two) times daily.   Multiple Vitamin (MULTIVITAMIN WITH MINERALS) TABS tablet Take 1 tablet by mouth daily.   naltrexone (DEPADE) 50 MG tablet Take 25 mg by mouth daily.   nitroGLYCERIN (NITROSTAT) 0.4 MG SL tablet Place 0.4 mg under the tongue every 5 (five) minutes x 3 doses as needed for chest pain.    polyethylene glycol (MIRALAX / GLYCOLAX) packet Take 17 g by mouth daily.    ticagrelor (BRILINTA) 60 MG TABS tablet Take 1 tablet (60 mg total) by mouth 2 (two) times daily.   traZODone (DESYREL) 50 MG tablet Take 50 mg by mouth at bedtime.    Allergies: Penicillins  Social History   Tobacco Use   Smoking status: Never   Smokeless tobacco: Never  Vaping Use   Vaping Use: Never used  Substance Use Topics   Alcohol use: No   Drug use: No    Family History  Problem Relation Age of Onset   Diabetes Mother    CAD Father    Polycystic kidney disease Father    Aneurysm Sister    Polycystic kidney disease Brother     Review of Systems: A 12-system review of systems was performed and was negative except as noted in the HPI.  --------------------------------------------------------------------------------------------------  Physical Exam: BP 100/70 (BP Location: Left Arm, Patient Position: Sitting, Cuff Size: Normal)   Pulse 61   Ht 5\' 4"  (1.626 m)   Wt 155 lb (70.3 kg)   SpO2 98%   BMI 26.61 kg/m    General:  NAD. Neck: No JVD or HJR. Lungs: Clear to auscultation bilaterally without wheezes or crackles. Heart: Regular rate and rhythm without murmurs, rubs, or gallops. Abdomen: Soft, nontender, nondistended. Extremities: No lower extremity edema.  EKG: Normal sinus rhythm with incomplete right bundle branch block.  Compared with prior tracing from 04/12/2020, incomplete RBBB is new.  Lab Results  Component Value Date   WBC 3.4 04/12/2020   HGB 12.9 04/12/2020   HCT 41.2 04/12/2020   MCV 84 04/12/2020   PLT 259 04/12/2020    Lab Results  Component Value Date   NA 138 04/12/2020   K 4.6 04/12/2020   CL 100 04/12/2020   CO2 26 04/12/2020   BUN 12 04/12/2020   CREATININE 0.90 04/12/2020   GLUCOSE 82 04/12/2020   ALT 19 04/12/2020    Lab Results  Component Value Date   CHOL 142 04/12/2020   HDL 70 04/12/2020   LDLCALC 64 04/12/2020   TRIG 32 04/12/2020   CHOLHDL 2.0 04/12/2020    --------------------------------------------------------------------------------------------------  ASSESSMENT AND PLAN: Coronary artery disease: No further angina reported.  Tamara [name] elected to discontinue isosorbide mononitrate due to absence of further chest pain, which I think is reasonable.  We will continue low-dose metoprolol as well as indefinite dual antiplatelet therapy with aspirin and ticagrelor given multivessel PCI including complex bifurcation stenting of LCx/OM1 at the time of her STEMI in 2017.  Hyperlipidemia: Most recent lipid panel in 04/2020 showed LDL and triglycerides at goal.  We will continue atorvastatin 40 mg daily.  Incomplete right bundle branch block: New finding on today's EKG.  No symptoms reported.  Plan to repeat EKG at follow-up visit in 6 months.  If this at progresses any further, would favor obtaining echocardiogram to exclude new structural abnormality that may underlie this.  Follow-up: Return to clinic in 6 months.  [name],  MD 01/05/2021 8:36 AM

## 2021-01-05 NOTE — Patient Instructions (Signed)
Medication Instructions: Your physician recommends that you continue on your current medications as directed. Please refer to the Current Medication list given to you today.   *If you need a refill on your cardiac medications before your next appointment, please call your pharmacy*   Lab Work: NONE   If you have labs (blood work) drawn today and your tests are completely normal, you will receive your results only by: MyChart Message (if you have MyChart) OR A paper copy in the mail If you have any lab test that is abnormal or we need to change your treatment, we will call you to review the results.   Testing/Procedures: NONE    Follow-Up: At [facility], you and your health needs are our priority.  As part of our continuing mission to provide you with exceptional heart care, we have created designated Provider Care Teams.  These Care Teams include your primary Cardiologist (physician) and Advanced Practice Providers (APPs -  Physician Assistants and Nurse Practitioners) who all work together to provide you with the care you need, when you need it.  We recommend signing up for the patient portal called "MyChart".  Sign up information is provided on this After Visit Summary.  MyChart is used to connect with patients for Virtual Visits (Telemedicine).  Patients are able to view lab/test results, encounter notes, upcoming appointments, etc.  Non-urgent messages can be sent to your provider as well.   To learn more about what you can do with MyChart, go to [email or web address].    Your next appointment:   6 month(s)  The format for your next appointment:   In Person   Provider:   You may see Christopher End, M.D. or one of the following Advanced Practice Providers on your designated Care Team:   Christopher Berge, NP Ryan Dunn, PA-C Jacquelyn Visser, PA-C Cadence Furth, PA-C      

## 2021-03-01 ENCOUNTER — Other Ambulatory Visit: Payer: Self-pay | Admitting: Internal Medicine

## 2021-07-19 NOTE — Progress Notes (Signed)
? ?Follow-up Outpatient Visit ?Date: 07/20/2021 ? ?Primary Care Provider: ?Eddie Dibbles, MD ?[address] ?[place] [address] ? ?Chief Complaint: Follow-up coronary artery disease ? ?HPI:  Tamara [name] is a 61 y.o. female with history of coronary artery disease status post PCI to the LAD and LCx/OM, ischemic cardiomyopathy with mildly reduced LVEF at the time of his STEMI in 12/2015, hyperlipidemia, and anxiety, who presents for follow-up of coronary artery disease.  I last saw her in 12/2020, which time she was feeling well.  She was no longer taking isosorbide mononitrate, as she felt like it was no longer necessary.  She was frustrated by inability to lose weight.  We did not make any medication changes or pursue further testing. ? ?Ms. Rocks reports that she is doing well from a heart standpoint, denying chest pain, shortness of breath, palpitations, lightheadedness, and edema.  She saw her PCP in January, at which time she complained about URI symptoms and vague chest discomfort.  HS-TnI was negative at that time.  Symptoms resolved on their own.  Ms. Arts continues to walk regularly without any angina.  She remains frustrated by her inability to lose weight.  She also is bothered by emotional lability, depression, and insomnia, which she attributes to menopause. ? ?-------------------------------------------------------------------------------------------------- ? ?Cardiovascular History & Procedures: ?Cardiovascular Problems: ?Coronary artery disease status post MI x 2 (most recently 12/2015) ?Ischemic cardiomyopathy ?  ?Risk Factors: ?Known coronary artery disease, hyperlipidemia ?  ?Cath/PCI: ?LHC (04/18/2020): LMCA normal.  LAD with patent distal stent with 10% ISR.  LCx with bifurcation stenting of LCx and OM1 with up to 20% ISR.  Normal dominant RCA.  LVEF 50-55% with mildly elevated filling pressure (LVEDP 15-20 mmHg). ?LHC/PCI (01/11/16): LMCA normal.  LAD with distal stent with mild ISR  (~10%).  Hazy 50% stenosis in proximal LCx with occlusion of OM1.  Question spontaneous dissection versus plaque erosion and thrombosis of OM1.  40% ostial and 30% distal LCx stenoses also present.  RCA normal.  Successful PCI to LCx/OM with placement of 3 Promus Premier drug-eluting stents using DK-crush technique. ?  ?CV Surgery: ?None ?  ?EP Procedures and Devices: ?None ?  ?Non-Invasive Evaluation(s): ?Exercise myocardial perfusion stress test (08/19/17): Abnormal, low risk stress test with small in size, mild in severity, fixed mid inferolateral and apical lateral defect that may represent subtle scar and/or artifact.  No significant ischemia.  LVEF greater than 65%.  Good exercise capacity with rare PVCs at rest and during recovery. ?TTE (01/12/16): Mildly dilated LV with normal wall thickness and basal/mid inferior hypokinesis (LVEF 45-50%).  No significant valvular disease.  Normal RV size/function.  Upper normal to mildly elevated pulmonary artery pressure.  Normal central venous pressure. ? ?Recent CV Pertinent Labs: ?Lab Results  ?Component Value Date  ? CHOL 142 04/12/2020  ? HDL 70 04/12/2020  ? LDLCALC 64 04/12/2020  ? TRIG 32 04/12/2020  ? CHOLHDL 2.0 04/12/2020  ? CHOLHDL 2.1 02/18/2019  ? INR 1.10 01/11/2016  ? K 4.6 04/12/2020  ? K 4.3 06/09/2012  ? BUN 12 04/12/2020  ? BUN 12 06/09/2012  ? CREATININE 0.90 04/12/2020  ? CREATININE 0.71 06/09/2012  ? ? ?Past medical and surgical history were reviewed and updated in EPIC. ? ?Current Meds  ?Medication Sig  ? aspirin EC 81 MG tablet Take 81 mg by mouth daily. FOR HEART HEALTH  ? atorvastatin (LIPITOR) 40 MG tablet Take 40 mg by mouth daily at 6 PM.  ? BRILINTA 60 MG  TABS tablet Take 1 tablet by mouth twice daily  ? buPROPion (WELLBUTRIN XL) 150 MG 24 hr tablet Take 150 mg by mouth daily.  ? Cholecalciferol (VITAMIN D3) 50 MCG (2000 UT) TABS Take 2,000 Units by mouth daily.  ? cyclobenzaprine (FLEXERIL) 10 MG tablet Take 10 mg by mouth 3 (three) times daily  as needed for muscle spasms.  ? docusate sodium (COLACE) 100 MG capsule Take 100 mg by mouth daily.  ? fluticasone (FLONASE) 50 MCG/ACT nasal spray Place 2 sprays into both nostrils daily as needed for allergies.  ? gabapentin (NEURONTIN) 300 MG capsule Take 300 mg by mouth daily as needed.  ? ibuprofen (ADVIL) 800 MG tablet Take 800 mg by mouth every 6 (six) hours as needed (pain).   ? ibuprofen (ADVIL) 800 MG tablet Take 800 mg by mouth every 6 (six) hours as needed.  ? metoprolol tartrate (LOPRESSOR) 25 MG tablet Take 0.5 tablets (12.5 mg total) by mouth 2 (two) times daily.  ? Multiple Vitamin (MULTIVITAMIN WITH MINERALS) TABS tablet Take 1 tablet by mouth daily.  ? nitroGLYCERIN (NITROSTAT) 0.4 MG SL tablet Place 0.4 mg under the tongue every 5 (five) minutes x 3 doses as needed for chest pain.   ? polyethylene glycol (MIRALAX / GLYCOLAX) packet Take 17 g by mouth daily.   ? traZODone (DESYREL) 50 MG tablet Take 50 mg by mouth at bedtime.  ? ? ?Allergies: Penicillins ? ?Social History  ? ?Tobacco Use  ? Smoking status: Never  ? Smokeless tobacco: Never  ?Vaping Use  ? Vaping Use: Never used  ?Substance Use Topics  ? Alcohol use: No  ? Drug use: No  ? ? ?Family History  ?Problem Relation Age of Onset  ? Diabetes Mother   ? CAD Father   ? Polycystic kidney disease Father   ? Aneurysm Sister   ? Polycystic kidney disease Brother   ? ? ?Review of Systems: ?A 12-system review of systems was performed and was negative except as noted in the HPI. ? ?-------------------------------------------------------------------------------------------------- ? ?Physical Exam: ?BP 126/70 (BP Location: Left Arm, Patient Position: Sitting, Cuff Size: Normal)   Pulse (!) 55   Ht 5\' 5"  (1.651 m)   Wt 151 lb (68.5 kg)   SpO2 98%   BMI 25.13 kg/m?  ? ?General:  NAD. ?Neck: No JVD or HJR. ?Lungs: Clear to auscultation bilaterally without wheezes or crackles. ?Heart: Regular rate and rhythm without murmurs, rubs, or  gallops. ?Abdomen: Soft, nontender, nondistended. ?Extremities: No lower extremity edema. ? ?EKG:  NSR with incomplete RBBB.  No significant change from prior tracing on 01/05/2021. ? ?Lab Results  ?Component Value Date  ? WBC 3.4 04/12/2020  ? HGB 12.9 04/12/2020  ? HCT 41.2 04/12/2020  ? MCV 84 04/12/2020  ? PLT 259 04/12/2020  ? ? ?Lab Results  ?Component Value Date  ? NA 138 04/12/2020  ? K 4.6 04/12/2020  ? CL 100 04/12/2020  ? CO2 26 04/12/2020  ? BUN 12 04/12/2020  ? CREATININE 0.90 04/12/2020  ? GLUCOSE 82 04/12/2020  ? ALT 19 04/12/2020  ? ? ?Lab Results  ?Component Value Date  ? CHOL 142 04/12/2020  ? HDL 70 04/12/2020  ? LDLCALC 64 04/12/2020  ? TRIG 32 04/12/2020  ? CHOLHDL 2.0 04/12/2020  ? ? ?-------------------------------------------------------------------------------------------------- ? ?ASSESSMENT AND PLAN: ?CAD: ?No angina reported.  Vague chest discomfort in January in setting of URI resolved without intervention and is unlikely to have been cardiac in nature.  We have agreed  to stop aspirin but continue indefinite antiplatelet therapy with ticagrelor 60 mg BID. ? ?Hyperlipidemia: ?We will check a lipid panel and CMP today with plans to continue atorvastatin 40 mg daily for target LDL < 70. ? 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  No symptoms of high-grade AV block.  Continue clinical follow-up with repeat EKG in 1 year. ? ?Menopause and depression: ?We discussed potential risks of HRT, particularly given Ms. Fiallos's history of CAD.  Unless she has significant lifestyle-limiting symptoms, I would recommend against HRT.  I encouraged her to speak to her PCP about other potential therapies, including titration of bupropion or trial of another antidepressant. ? ?Follow-up: Return to clinic in 1 year. ? ?Yvonne Kendallhristopher Reuben Knoblock, MD ?07/20/2021 ?8:07 AM ? ?

## 2021-07-20 ENCOUNTER — Other Ambulatory Visit: Payer: Self-pay

## 2021-07-20 ENCOUNTER — Encounter: Payer: Self-pay | Admitting: Internal Medicine

## 2021-07-20 ENCOUNTER — Ambulatory Visit: Payer: 59 | Admitting: Internal Medicine

## 2021-07-20 VITALS — BP 126/70 | HR 55 | Ht 65.0 in | Wt 151.0 lb

## 2021-07-20 DIAGNOSIS — I25119 Atherosclerotic heart disease of native coronary artery with unspecified angina pectoris: Secondary | ICD-10-CM

## 2021-07-20 DIAGNOSIS — I2510 Atherosclerotic heart disease of native coronary artery without angina pectoris: Secondary | ICD-10-CM

## 2021-07-20 DIAGNOSIS — I4510 Unspecified right bundle-branch block: Secondary | ICD-10-CM

## 2021-07-20 DIAGNOSIS — F32A Depression, unspecified: Secondary | ICD-10-CM

## 2021-07-20 DIAGNOSIS — E785 Hyperlipidemia, unspecified: Secondary | ICD-10-CM

## 2021-07-20 DIAGNOSIS — Z780 Asymptomatic menopausal state: Secondary | ICD-10-CM

## 2021-07-20 MED ORDER — TICAGRELOR 60 MG PO TABS: 60.0000 mg | ORAL_TABLET | Freq: Two times a day (BID) | ORAL | 3 refills | Status: DC

## 2021-07-20 NOTE — Patient Instructions (Signed)
Medication Instructions:  ? ?Your physician has recommended you make the following change in your medication:  ? ?STOP Aspirin ? ?[place] have been sent to your pharmacy ? ?*If you need a refill on your cardiac medications before your next appointment, please call your pharmacy* ? ? ?Lab Work: ? ?Today: CMET, Lipid panel ? ?If you have labs (blood work) drawn today and your tests are completely normal, you will receive your results only by: ?MyChart Message (if you have MyChart) OR ?A paper copy in the mail ?If you have any lab test that is abnormal or we need to change your treatment, we will call you to review the results. ? ? ?Testing/Procedures: ? ?None ordered ? ? ?Follow-Up: ?At [facility], you and your health needs are our priority.  As part of our continuing mission to provide you with exceptional heart care, we have created designated Provider Care Teams.  These Care Teams include your primary Cardiologist (physician) and Advanced Practice Providers (APPs -  Physician Assistants and Nurse Practitioners) who all work together to provide you with the care you need, when you need it. ? ?We recommend signing up for the patient portal called "MyChart".  Sign up information is provided on this After Visit Summary.  MyChart is used to connect with patients for Virtual Visits (Telemedicine).  Patients are able to view lab/test results, encounter notes, upcoming appointments, etc.  Non-urgent messages can be sent to your provider as well.   ?To learn more about what you can do with MyChart, go to [email or web address].   ? ?Your next appointment:   ?1 year(s) ? ?The format for your next appointment:   ?In Person ? ?Provider:   ?You may see Dr. Harrell Gave End or one of the following Advanced Practice Providers on your designated Care Team:   ?Murray Hodgkins, NP ?Christell Faith, PA-C ?Cadence Kathlen Mody, PA-C ?

## 2021-07-21 LAB — LIPID PANEL
Chol/HDL Ratio: 2.3 ratio (ref 0.0–4.4)
Cholesterol, Total: 150 mg/dL (ref 100–199)
HDL: 66 mg/dL (ref >39)
LDL Chol Calc (NIH): 72 mg/dL (ref 0–99)
Triglycerides: 57 mg/dL (ref 0–149)
VLDL Cholesterol Cal: 12 mg/dL (ref 5–40)

## 2021-07-21 LAB — COMPREHENSIVE METABOLIC PANEL
ALT: 15 IU/L (ref 0–32)
AST: 22 IU/L (ref 0–40)
Albumin/Globulin Ratio: 1.7 (ref 1.2–2.2)
Albumin: 4.3 g/dL (ref 3.8–4.9)
Alkaline Phosphatase: 86 IU/L (ref 44–121)
BUN/Creatinine Ratio: 11 (ref 9–23)
BUN: 11 mg/dL (ref 6–24)
Bilirubin Total: 0.4 mg/dL (ref 0.0–1.2)
CO2: 27 mmol/L (ref 20–29)
Calcium: 9.8 mg/dL (ref 8.7–10.2)
Chloride: 100 mmol/L (ref 96–106)
Creatinine, Ser: 1 mg/dL (ref 0.57–1.00)
Globulin, Total: 2.5 g/dL (ref 1.5–4.5)
Glucose: 83 mg/dL (ref 70–99)
Potassium: 4.6 mmol/L (ref 3.5–5.2)
Sodium: 141 mmol/L (ref 134–144)
Total Protein: 6.8 g/dL (ref 6.0–8.5)
eGFR: 65 mL/min/{1.73_m2} (ref >59)

## 2021-07-25 ENCOUNTER — Telehealth: Payer: Self-pay | Admitting: *Deleted

## 2021-07-25 DIAGNOSIS — Z79899 Other long term (current) drug therapy: Secondary | ICD-10-CM

## 2021-07-25 DIAGNOSIS — E785 Hyperlipidemia, unspecified: Secondary | ICD-10-CM

## 2021-07-25 MED ORDER — ATORVASTATIN CALCIUM 80 MG PO TABS: 80.0000 mg | ORAL_TABLET | Freq: Every day | ORAL | 3 refills | Status: AC

## 2021-07-25 NOTE — Telephone Encounter (Signed)
Spoke with pt. Notified of lab results and Dr. Darnelle Bos recc.  ?Pt voiced understanding.  ?Pt will incr atorvastatin to 80 mg daily.  ?Pt will double current dose of 40 mg - 2 tablets daily, until starts new Rx.  ?Rx sent to pt's mail order pharmacy per request.  ?Lab orders placed and forwarded to scheduling to place recall for 3 month fasting lab.  ? ?Pt has no further questions at this time.  ?

## 2021-07-25 NOTE — Telephone Encounter (Signed)
-----   Message from [name], MD sent at 07/23/2021  7:15 AM EDT ----- ?Please let Ms. Bragdon know that her cholesterol has risen slightly since 04/2020 and is now just above our goal (LDL 72; target < 70).  I suggest that we increase atorvastatin to 80 mg daily with repeat lipid panel and ALT in ~3 months, if Ms. Rabel is in agreement.  Her kidney function, liver function, and electrolytes are normal. ?

## 2021-10-26 ENCOUNTER — Telehealth: Payer: Self-pay | Admitting: *Deleted

## 2021-10-26 NOTE — Telephone Encounter (Signed)
Attempted to call pt to remind of having 3 month fasting lipid / [facility].  Orders are in place.   No answer. LDM with instructions for lab work. (DPR)

## 2021-10-30 NOTE — Telephone Encounter (Signed)
Attempted to call pt. No answer. Lmtcb.  

## 2021-11-01 NOTE — Telephone Encounter (Signed)
Spoke with pt. Notified 3 month fasting labs are due and instructions given.  Pt voiced understanding.

## 2021-11-02 ENCOUNTER — Other Ambulatory Visit
Admission: RE | Admit: 2021-11-02 | Discharge: 2021-11-02 | Disposition: A | Discharge status: Home / Self Care | Payer: 59 | Attending: Internal Medicine | Admitting: Internal Medicine

## 2021-11-02 DIAGNOSIS — E785 Hyperlipidemia, unspecified: Secondary | ICD-10-CM | POA: Insufficient documentation

## 2021-11-02 DIAGNOSIS — Z79899 Other long term (current) drug therapy: Secondary | ICD-10-CM | POA: Insufficient documentation

## 2021-11-02 LAB — LIPID PANEL
Cholesterol: 118 mg/dL (ref 0–200)
HDL: 57 mg/dL (ref >40)
LDL Cholesterol: 58 mg/dL (ref 0–99)
Total CHOL/HDL Ratio: 2.1 RATIO
Triglycerides: 17 mg/dL (ref <150)
VLDL: 3 mg/dL (ref 0–40)

## 2021-11-02 LAB — ALT: ALT: 19 U/L (ref 0–44)

## 2022-07-29 ENCOUNTER — Other Ambulatory Visit: Payer: Self-pay

## 2022-07-29 MED ORDER — TICAGRELOR 60 MG PO TABS: 60.0000 mg | ORAL_TABLET | Freq: Two times a day (BID) | ORAL | 0 refills | Status: DC

## 2022-10-04 ENCOUNTER — Other Ambulatory Visit
Admission: RE | Admit: 2022-10-04 | Discharge: 2022-10-04 | Disposition: A | Discharge status: Home / Self Care | Payer: 59 | Attending: Internal Medicine | Admitting: Internal Medicine

## 2022-10-04 ENCOUNTER — Other Ambulatory Visit: Payer: Self-pay | Admitting: Internal Medicine

## 2022-10-04 ENCOUNTER — Encounter: Payer: Self-pay | Admitting: Internal Medicine

## 2022-10-04 ENCOUNTER — Ambulatory Visit: Payer: 59 | Attending: Internal Medicine | Admitting: Internal Medicine

## 2022-10-04 VITALS — BP 122/80 | HR 57 | Ht 65.0 in | Wt 167.0 lb

## 2022-10-04 DIAGNOSIS — I2510 Atherosclerotic heart disease of native coronary artery without angina pectoris: Secondary | ICD-10-CM | POA: Diagnosis not present

## 2022-10-04 DIAGNOSIS — Z79899 Other long term (current) drug therapy: Secondary | ICD-10-CM | POA: Insufficient documentation

## 2022-10-04 DIAGNOSIS — E785 Hyperlipidemia, unspecified: Secondary | ICD-10-CM

## 2022-10-04 DIAGNOSIS — I4510 Unspecified right bundle-branch block: Secondary | ICD-10-CM | POA: Diagnosis not present

## 2022-10-04 LAB — CBC
HCT: 39.2 % (ref 36.0–46.0)
Hemoglobin: 12.4 g/dL (ref 12.0–15.0)
MCH: 27.2 pg (ref 26.0–34.0)
MCHC: 31.6 g/dL (ref 30.0–36.0)
MCV: 86 fL (ref 80.0–100.0)
Platelets: 238 10*3/uL (ref 150–400)
RBC: 4.56 MIL/uL (ref 3.87–5.11)
RDW: 13.2 % (ref 11.5–15.5)
WBC: 3.3 10*3/uL — ABNORMAL LOW (ref 4.0–10.5)
nRBC: 0 % (ref 0.0–0.2)

## 2022-10-04 LAB — ALT: ALT: 22 U/L (ref 0–44)

## 2022-10-04 NOTE — Patient Instructions (Signed)
Medication Instructions:  Your Physician recommend you continue on your current medication as directed.    *If you need a refill on your cardiac medications before your next appointment, please call your pharmacy*   Lab Work: Your provider would like for you to have following labs drawn: (CBC, ALT).   Please go to the [facility] entrance and check in at the front desk.  You do not need an appointment.  They are open from 7am-6 pm.   If you have labs (blood work) drawn today and your tests are completely normal, you will receive your results only by: MyChart Message (if you have MyChart) OR A paper copy in the mail If you have any lab test that is abnormal or we need to change your treatment, we will call you to review the results.   Testing/Procedures: None ordered today   Follow-Up: At [facility], you and your health needs are our priority.  As part of our continuing mission to provide you with exceptional heart care, we have created designated Provider Care Teams.  These Care Teams include your primary Cardiologist (physician) and Advanced Practice Providers (APPs -  Physician Assistants and Nurse Practitioners) who all work together to provide you with the care you need, when you need it.  We recommend signing up for the patient portal called "MyChart".  Sign up information is provided on this After Visit Summary.  MyChart is used to connect with patients for Virtual Visits (Telemedicine).  Patients are able to view lab/test results, encounter notes, upcoming appointments, etc.  Non-urgent messages can be sent to your provider as well.   To learn more about what you can do with MyChart, go to [email or web address].    Your next appointment:   1 year(s)  Provider:   You may see [name], MD or one of the following Advanced Practice Providers on your designated Care Team:   Nicolasa Ducking, NP Eula Listen, PA-C Cadence Fransico Michael, PA-C Charlsie Quest, NP

## 2022-10-04 NOTE — Progress Notes (Signed)
Follow-up Outpatient Visit Date: 10/04/2022  Primary Care Provider: Eddie Dibbles, MD [address] [place] [address]  Chief Complaint: Follow-up coronary artery disease  HPI:  [name] is a 62 y.o. female with history of coronary artery disease status post PCI to the LAD and LCx/OM, ischemic cardiomyopathy with mildly reduced LVEF at the time of his STEMI in 12/2015, hyperlipidemia, and anxiety, who presents for follow-up of coronary artery disease.  I last saw her in 07/2021, at which time she was doing well.  We agreed to stop aspirin and continue long-term ticagrelor.  No other medication changes or additional testing were pursued.  Today, [name] reports that she has continued to feel well, denying chest pain, shortness breath, palpitations, lightheadedness, and edema.  She is tolerating her medications well.  Her only concern is difficulty losing weight despite trying to eat a healthy diet and exercise.  On further questioning, she tries to drink an Ensure with her breakfast every morning in order to get adequate vitamins and minerals.  --------------------------------------------------------------------------------------------------  Cardiovascular History & Procedures: Cardiovascular Problems: Coronary artery disease status post MI x 2 (most recently 12/2015) Ischemic cardiomyopathy   Risk Factors: Known coronary artery disease, hyperlipidemia   Cath/PCI: LHC (04/18/2020): LMCA normal.  LAD with patent distal stent with 10% ISR.  LCx with bifurcation stenting of LCx and OM1 with up to 20% ISR.  Normal dominant RCA.  LVEF 50-55% with mildly elevated filling pressure (LVEDP 15-20 mmHg). LHC/PCI (01/11/16): LMCA normal.  LAD with distal stent with mild ISR (~10%).  Hazy 50% stenosis in proximal LCx with occlusion of OM1.  Question spontaneous dissection versus plaque erosion and thrombosis of OM1.  40% ostial and 30% distal LCx stenoses also present.  RCA normal.   Successful PCI to LCx/OM with placement of 3 Promus Premier drug-eluting stents using DK-crush technique.  Recent CV Pertinent Labs: Lab Results  Component Value Date   CHOL 118 11/02/2021   CHOL 150 07/20/2021   HDL 57 11/02/2021   HDL 66 07/20/2021   LDLCALC 58 11/02/2021   LDLCALC 72 07/20/2021   TRIG 17 11/02/2021   CHOLHDL 2.1 11/02/2021   INR 1.10 01/11/2016   K 4.6 07/20/2021   K 4.3 06/09/2012   BUN 11 07/20/2021   BUN 12 06/09/2012   CREATININE 1.00 07/20/2021   CREATININE 0.71 06/09/2012    Past medical and surgical history were reviewed and updated in EPIC.  Current Meds  Medication Sig   atorvastatin (LIPITOR) 80 MG tablet Take 1 tablet (80 mg total) by mouth daily at 6 PM.   buPROPion (WELLBUTRIN XL) 150 MG 24 hr tablet Take 150 mg by mouth daily.   Cholecalciferol (VITAMIN D3) 50 MCG (2000 UT) TABS Take 2,000 Units by mouth daily.   clobetasol cream (TEMOVATE) 0.05 % Apply topically as needed.   cyclobenzaprine (FLEXERIL) 10 MG tablet Take 10 mg by mouth 3 (three) times daily as needed for muscle spasms.   docusate sodium (COLACE) 100 MG capsule Take 100 mg by mouth daily.   escitalopram (LEXAPRO) 10 MG tablet Take 10 mg by mouth daily.   fluticasone (FLONASE) 50 MCG/ACT nasal spray Place 2 sprays into both nostrils daily as needed for allergies.   gabapentin (NEURONTIN) 300 MG capsule Take 300 mg by mouth daily as needed.   ibuprofen (ADVIL) 800 MG tablet Take 800 mg by mouth every 6 (six) hours as needed (pain).    lactulose (CHRONULAC) 10 GM/15ML solution Take by mouth  daily as needed.   metoprolol tartrate (LOPRESSOR) 25 MG tablet Take 0.5 tablets (12.5 mg total) by mouth 2 (two) times daily.   Multiple Vitamin (MULTIVITAMIN WITH MINERALS) TABS tablet Take 1 tablet by mouth daily.   nitroGLYCERIN (NITROSTAT) 0.4 MG SL tablet Place 0.4 mg under the tongue every 5 (five) minutes x 3 doses as needed for chest pain.    polyethylene glycol (MIRALAX / GLYCOLAX)  packet Take 17 g by mouth daily.    PROCTOFOAM HC rectal foam Place 1 applicator rectally daily as needed.   ticagrelor (BRILINTA) 60 MG TABS tablet Take 1 tablet (60 mg total) by mouth 2 (two) times daily.   traZODone (DESYREL) 50 MG tablet Take 50 mg by mouth at bedtime.   triamcinolone ointment (KENALOG) 0.1 % as needed.    Allergies: Penicillins  Social History   Tobacco Use   Smoking status: Never   Smokeless tobacco: Never  Vaping Use   Vaping Use: Never used  Substance Use Topics   Alcohol use: No   Drug use: No    Family History  Problem Relation Age of Onset   Diabetes Mother    CAD Father    Polycystic kidney disease Father    Aneurysm Sister    Polycystic kidney disease Brother     Review of Systems: A 12-system review of systems was performed and was negative except as noted in the HPI.  --------------------------------------------------------------------------------------------------  Physical Exam: BP 122/80 (BP Location: Left Arm, Patient Position: Sitting)   Pulse (!) 57   Ht 5\' 5"  (1.651 m)   Wt 167 lb 0.2 oz (75.8 kg)   SpO2 99%   BMI 27.79 kg/m   General:  NAD. Neck: No JVD or HJR. Lungs: Clear to auscultation bilaterally without wheezes or crackles. Heart: Bradycardic but regular without murmurs, rubs, or gallops. Abdomen: Soft, nontender, nondistended. Extremities: No lower extremity edema.  EKG: Sinus bradycardia (heart rate 57 bpm) with right bundle branch block.  No significant change from prior tracing on 07/20/2021.  Lab Results  Component Value Date   WBC 3.4 04/12/2020   HGB 12.9 04/12/2020   HCT 41.2 04/12/2020   MCV 84 04/12/2020   PLT 259 04/12/2020    Lab Results  Component Value Date   NA 141 07/20/2021   K 4.6 07/20/2021   CL 100 07/20/2021   CO2 27 07/20/2021   BUN 11 07/20/2021   CREATININE 1.00 07/20/2021   GLUCOSE 83 07/20/2021   ALT 19 11/02/2021    Lab Results  Component Value Date   CHOL 118 11/02/2021    HDL 57 11/02/2021   LDLCALC 58 11/02/2021   TRIG 17 11/02/2021   CHOLHDL 2.1 11/02/2021    --------------------------------------------------------------------------------------------------  ASSESSMENT AND PLAN: Coronary artery disease: [name] continues to do well without recurrent angina.  We will continue secondary prevention with ticagrelor, atorvastatin, and low-dose metoprolol.  I will check a CBC today to ensure stable hemoglobin and platelets in the setting of long-term antiplatelet therapy.  I have encouraged [name] to continue with her healthy diet and exercise to help with weight loss.  I suggested that she try cutting out her daily Ensure.  Hyperlipidemia: Lipids well-controlled on last check with PCP in March.  LFTs have not been checked in the last year.  We will therefore check an ALT in the setting of long-term statin therapy.  Continue atorvastatin 80 mg daily.  Right bundle branch block: QRS duration slightly longer compared to last year but  overall very similar.  Normal PR interval again noted.  No symptoms of high-grade AV block.  Continue annual EKG monitoring.  Follow-up: Return to clinic in 1 year.  [name], MD 10/04/2022 8:22 AM

## 2022-10-08 MED ORDER — TICAGRELOR 60 MG PO TABS: 60.0000 mg | ORAL_TABLET | Freq: Two times a day (BID) | ORAL | 3 refills | Status: DC

## 2022-10-08 NOTE — Addendum Note (Signed)
Addended by: Parke Poisson on: 10/08/2022 03:27 PM   Modules accepted: Orders

## 2022-11-15 ENCOUNTER — Encounter: Payer: Self-pay | Admitting: Internal Medicine

## 2022-11-18 ENCOUNTER — Encounter: Payer: Self-pay | Admitting: Internal Medicine

## 2022-11-19 ENCOUNTER — Telehealth: Payer: Self-pay | Admitting: Internal Medicine

## 2022-11-19 NOTE — Telephone Encounter (Signed)
Dr. Okey Dupre,  You saw this patient on 10/04/2022. Will you please comment on medical clearance for colonoscopy?  Please route your response to P CV DIV Preop. I will communicate with requesting office once you have given recommendations.   Thank you!  Tamara Levering, NP

## 2022-11-19 NOTE — Telephone Encounter (Signed)
   Pre-operative Risk Assessment    Patient Name: Tamara [name]  DOB: January 23, 1961 MRN: [id number]     Request for Surgical Clearance    Procedure:   COLONOSCOPY  Date of Surgery:  Clearance TBD                               Surgeon:  NOT INDICATED Surgeon's Group or Practice Name:  [facility] Phone number:  [phone] Fax number:  [phone]  Type of Clearance Requested:   - Pharmacy:  Hold Ticagrelor (Brilinta) 3 DAYS  Type of Anesthesia:  Not Indicated   Additional requests/questions:    Signed, Dalia Heading   11/19/2022, 10:16 AM

## 2022-11-20 NOTE — Telephone Encounter (Signed)
   Patient Name: Tamara [name]  DOB: [birth year] MRN: [id number]  Primary Cardiologist: [name], MD  Chart reviewed as part of pre-operative protocol coverage. Pre-op clearance already addressed by colleagues in earlier phone notes. To summarize recommendations:  - I think it is reasonable for Ms. Trillo to proceed with her colonoscopy, which is a low risk procedure from a cardiovascular standpoint.  In regard to her antiplatelet therapy, I recommend that she hold ticagrelor for 5 days before the procedure and restart it as soon as possible after her colonoscopy.  While she is not taking ticagrelor, she should take aspirin 81 mg daily.   [name], MD [facility]    Will route this bundled recommendation to requesting provider via Epic fax function and remove from pre-op pool. Please call with questions.  Sharlene Dory, PA-C 11/20/2022, 8:09 AM

## 2022-11-20 NOTE — Telephone Encounter (Signed)
I think it is reasonable for Tamara [name] to proceed with her colonoscopy, which is a low risk procedure from a cardiovascular standpoint.  In regard to her antiplatelet therapy, I recommend that she hold ticagrelor for 5 days before the procedure and restart it as soon as possible after her colonoscopy.  While she is not taking ticagrelor, she should take aspirin 81 mg daily.  [name], MD [facility]

## 2023-10-24 ENCOUNTER — Other Ambulatory Visit: Payer: Self-pay | Admitting: Internal Medicine

## 2023-11-05 ENCOUNTER — Encounter: Payer: Self-pay | Admitting: Internal Medicine

## 2023-11-05 ENCOUNTER — Ambulatory Visit: Attending: Internal Medicine | Admitting: Internal Medicine

## 2023-11-05 VITALS — BP 100/70 | HR 59 | Ht 64.0 in | Wt 139.4 lb

## 2023-11-05 DIAGNOSIS — I2510 Atherosclerotic heart disease of native coronary artery without angina pectoris: Secondary | ICD-10-CM | POA: Diagnosis not present

## 2023-11-05 DIAGNOSIS — Z79899 Other long term (current) drug therapy: Secondary | ICD-10-CM

## 2023-11-05 DIAGNOSIS — E785 Hyperlipidemia, unspecified: Secondary | ICD-10-CM | POA: Diagnosis not present

## 2023-11-05 DIAGNOSIS — I4510 Unspecified right bundle-branch block: Secondary | ICD-10-CM

## 2023-11-05 MED ORDER — TICAGRELOR 60 MG PO TABS: 60.0000 mg | ORAL_TABLET | Freq: Two times a day (BID) | ORAL | 3 refills | Status: DC

## 2023-11-05 NOTE — Progress Notes (Signed)
  Cardiology Office Note:  .   Date:  11/05/2023  ID:  Tamara [name], DOB December 17, 1960, MRN [id number] PCP: Isaiah Lynwood DEL, MD  [facility] Providers Cardiologist:  [name], MD     History of Present Illness: .   Tamara [name] is a 63 y.o. female with history of coronary artery disease status post PCI to the LAD and LCx/OM, ischemic cardiomyopathy with mildly reduced LVEF at the time of his STEMI in 12/2015, hyperlipidemia, and anxiety, who returns for follow-up of coronary artery disease.  I last saw her in 09/2018 for, at which time Tamara [name] continued to feel well without chest pain or shortness of breath.  She was frustrated by difficulty losing weight despite exercising and following a heart healthy diet.  We did not make any medication changes or pursue additional testing.  Today, Tamara [name] reports that she has been feeling well.  She denies chest pain, shortness of breath, palpitations, lightheadedness, and edema.  She continues to walk regularly, and recently adopted a puppy which has led to even more walking.  She notes that she ran out of ticagrelor  about a week ago but has instead been taking aspirin .  She had been tolerating ticagrelor  well without bleeding or other side effects.  ROS: See HPI  Studies Reviewed: SABRA   EKG Interpretation Date/Time:  Wednesday November 05 2023 16:22:14 EDT Ventricular Rate:  59 PR Interval:  156 QRS Duration:  112 QT Interval:  434 QTC Calculation: 429 R Axis:   0  Text Interpretation: Sinus bradycardia Right bundle branch block Abnormal ECG When compared with ECG of 05-Nov-2023 16:21, No significant change was found Confirmed by Rolf Fells, Lonni [phone]) on 11/05/2023 5:00:54 PM    Risk Assessment/Calculations:             Physical Exam:   VS:  BP 100/70 (BP Location: Right Arm, Patient Position: Sitting, Cuff Size: Normal)   Pulse (!) 59   Ht 5' 4 (1.626 m)   Wt 139 lb 6.4 oz (63.2 kg)   SpO2 98%   BMI 23.93 kg/m     Wt Readings from Last 3 Encounters:  11/05/23 139 lb 6.4 oz (63.2 kg)  10/04/22 167 lb 0.2 oz (75.8 kg)  07/20/21 151 lb (68.5 kg)    General:  NAD. Neck: No JVD or HJR. Lungs: Clear to auscultation bilaterally without wheezes or crackles. Heart: Regular rate and rhythm without murmurs, rubs, or gallops. Abdomen: Soft, nontender, nondistended. Extremities: No lower extremity edema.  ASSESSMENT AND PLAN: .    Coronary artery disease: Tamara [name] continues to do well without any recurrent angina.  We have discussed long-term antiplatelet therapy options including continuing ticagrelor  60 mg daily versus transitioning to clopidogrel or aspirin .  She wishes to continue with ticagrelor , as she has tolerated this well and has a very low out-of-pocket expense.  We will check a CBC and CMP at her convenience.  Hyperlipidemia: Continue atorvastatin  80 mg daily.  Check CMP and lipid panel at her convenience.  Right bundle branch block: Unchanged from last tracing in 09/2022.  We discussed discontinuation of low-dose metoprolol  given borderline low heart rate and blood pressure, but have agreed to continue this as Tamara [name] is tolerating it well.    Dispo: Return to clinic in 1 year.  Signed, [name], MD

## 2023-11-05 NOTE — Patient Instructions (Signed)
 Medication Instructions:  Your physician recommends that you continue on your current medications as directed. Please refer to the Current Medication list given to you today.    *If you need a refill on your cardiac medications before your next appointment, please call your pharmacy*  Lab Work: Your provider would like for you to return  to have the following labs drawn: (CBC, CMP, Lipid).   Please go to [facility] [address] ([facility], [place] [address] You do not need an appointment.  They are open from 8 am- 4:30 pm.  Lunch from 1:00 pm- 2:00 pm You will need to be fasting.    Testing/Procedures: No test ordered today   Follow-Up: At [facility], you and your health needs are our priority.  As part of our continuing mission to provide you with exceptional heart care, our providers are all part of one team.  This team includes your primary Cardiologist (physician) and Advanced Practice Providers or APPs (Physician Assistants and Nurse Practitioners) who all work together to provide you with the care you need, when you need it.  Your next appointment:   1 year(s)  Provider:   You may see [name], MD or one of the following Advanced Practice Providers on your designated Care Team:   Lonni Meager, NP Lesley Maffucci, PA-C Bernardino Bring, PA-C Cadence Revere, PA-C Tylene Lunch, NP Barnie Hila, NP

## 2023-11-11 ENCOUNTER — Ambulatory Visit: Payer: Self-pay | Admitting: Internal Medicine

## 2023-11-11 DIAGNOSIS — Z79899 Other long term (current) drug therapy: Secondary | ICD-10-CM

## 2023-11-11 LAB — COMPREHENSIVE METABOLIC PANEL WITH GFR
ALT: 20 IU/L (ref 0–32)
AST: 26 IU/L (ref 0–40)
Albumin: 4.1 g/dL (ref 3.9–4.9)
Alkaline Phosphatase: 70 IU/L (ref 44–121)
BUN/Creatinine Ratio: 14 (ref 12–28)
BUN: 12 mg/dL (ref 8–27)
Bilirubin Total: 0.5 mg/dL (ref 0.0–1.2)
CO2: 21 mmol/L (ref 20–29)
Calcium: 9.5 mg/dL (ref 8.7–10.3)
Chloride: 105 mmol/L (ref 96–106)
Creatinine, Ser: 0.88 mg/dL (ref 0.57–1.00)
Globulin, Total: 2.3 g/dL (ref 1.5–4.5)
Glucose: 82 mg/dL (ref 70–99)
Potassium: 4.3 mmol/L (ref 3.5–5.2)
Sodium: 142 mmol/L (ref 134–144)
Total Protein: 6.4 g/dL (ref 6.0–8.5)
eGFR: 75 mL/min/{1.73_m2} (ref >59)

## 2023-11-11 LAB — CBC
Hematocrit: 41 % (ref 34.0–46.6)
Hemoglobin: 12.8 g/dL (ref 11.1–15.9)
MCH: 28.4 pg (ref 26.6–33.0)
MCHC: 31.2 g/dL — ABNORMAL LOW (ref 31.5–35.7)
MCV: 91 fL (ref 79–97)
Platelets: 290 10*3/uL (ref 150–450)
RBC: 4.5 x10E6/uL (ref 3.77–5.28)
RDW: 12.7 % (ref 11.7–15.4)
WBC: 4.8 10*3/uL (ref 3.4–10.8)

## 2023-11-11 LAB — LIPID PANEL
Chol/HDL Ratio: 2.7 ratio (ref 0.0–4.4)
Cholesterol, Total: 136 mg/dL (ref 100–199)
HDL: 50 mg/dL (ref >39)
LDL Chol Calc (NIH): 76 mg/dL (ref 0–99)
Triglycerides: 44 mg/dL (ref 0–149)
VLDL Cholesterol Cal: 10 mg/dL (ref 5–40)

## 2023-11-11 MED ORDER — EZETIMIBE 10 MG PO TABS: 10.0000 mg | ORAL_TABLET | Freq: Every day | ORAL | 3 refills | Status: AC

## 2023-11-18 ENCOUNTER — Telehealth: Payer: Self-pay | Admitting: Internal Medicine

## 2023-11-18 ENCOUNTER — Telehealth: Payer: Self-pay | Admitting: Pharmacy Technician

## 2023-11-18 ENCOUNTER — Other Ambulatory Visit (HOSPITAL_COMMUNITY): Payer: Self-pay

## 2023-11-18 NOTE — Telephone Encounter (Addendum)
 I spoke with pt who called stating that her insurance was refusing to pay for her Brilinta .  She has been using the Brilinta  Savings Card, but her pharmacist said her insurance,[employer], was requesting that she use the generic brand, which both pt and I were unaware that there is a generic brand for Brilinta .  I sent a message to our Med Assist Pool for further advise.  I also downloaded another savings card from the Brilinta  website and emailed it to pt's email address:  [email or web address] so that she could take the coupon to her pharmacy. Pt appreciative of the assistance in this matter.

## 2023-11-18 NOTE — Telephone Encounter (Signed)
 Spoke with pt to let her know that our medical assist team sent another Prior Authorization Form to [facility] for Brilinta  and it was denied.  [employer] no longer has Brilinita on their formulary (see Pharmacy Patient Advocate Encounter).  [employer] state the alternative medication is Ticagrelor .  Pt states that if Dr. Mady approves of this generic medication or an alternative she will be okay with his suggestions.  Pt states that she received the Brilinta  Savings Card and will present that to her pharmacy.  Thanked us  for looking into this matter.  Will keep her updated on any changes in medication regimen.

## 2023-11-18 NOTE — Telephone Encounter (Signed)
 Pharmacy Patient Advocate Encounter   Received notification from Pt Calls Messages that prior authorization for Brilinta  60MG  is required/requested.   Insurance verification completed.   The patient is insured through [facility] .   Per test claim: PA required; PA submitted to above mentioned insurance via CoverMyMeds Key/confirmation #/EOC [id number] Status is pending

## 2023-11-18 NOTE — Telephone Encounter (Signed)
 Pharmacy Patient Advocate Encounter  Received notification from [facility] that Prior Authorization for Brand Brilinta  has been DENIED.  See denial reason below. No denial letter attached in CMM. Will attach denial letter to Media tab once received.  Automatically denied bc it is no longer a covered benefit and no criteria was considered in the denial    PA #/Case ID/Reference #: [id number]

## 2023-11-18 NOTE — Telephone Encounter (Signed)
 Pt c/o medication issue:  1. Name of Medication: ticagrelor  (BRILINTA ) 60 MG TABS tablet   2. How are you currently taking this medication (dosage and times per day)?    3. Are you having a reaction (difficulty breathing--STAT)? no  4. What is your medication issue? Patient's insurance is not covering medication anymore. Please advise

## 2023-11-20 ENCOUNTER — Telehealth: Payer: Self-pay | Admitting: Internal Medicine

## 2023-11-20 ENCOUNTER — Other Ambulatory Visit: Payer: Self-pay | Admitting: *Deleted

## 2023-11-20 MED ORDER — TICAGRELOR 60 MG PO TABS: 60.0000 mg | ORAL_TABLET | Freq: Two times a day (BID) | ORAL | 3 refills | Status: DC

## 2023-11-20 MED ORDER — TICAGRELOR 60 MG PO TABS: 60.0000 mg | ORAL_TABLET | Freq: Two times a day (BID) | ORAL | 3 refills | Status: AC

## 2023-11-20 NOTE — Telephone Encounter (Signed)
*  STAT* If patient is at the pharmacy, call can be transferred to refill team.   1. Which medications need to be refilled? (please list name of each medication and dose if known) ticagrelor  (BRILINTA ) 60 MG TABS tablet    2. Would you like to learn more about the convenience, safety, & potential cost savings by using the [facility]?      3. Are you open to using the [facility] (Type [facility].  ).   4. Which pharmacy/location (including street and city if local pharmacy) is medication to be sent to? [id number] - [place] ([place]), [place] - [address]    5. Do they need a 30 day or 90 day supply? 90 day

## 2023-11-20 NOTE — Progress Notes (Signed)
 Spoke with pt to let her know that Dr. Lonni End said it was ok for her to receive the generic Ticagrelor .  I let her know that we sent a prescription to her preferred [facility] pharmacy on [address]. Pt pleased since her insurance will not pay for Brilinta .

## 2023-11-20 NOTE — Telephone Encounter (Signed)
  Spoke with pt to let her know that Dr. Lonni End said it was ok for her to receive the generic Ticagrelor .  I let her know that we sent a prescription to her preferred [facility] pharmacy on [address]. Pt pleased since her insurance will not pay for Brilinta . Pt appreciative of the call back.

## 2023-11-20 NOTE — Telephone Encounter (Signed)
 It is fine to prescribe generic ticagrelor  60 mg twice daily.  If this is cost-prohibitive, the alternative would be to switch to clopidogrel 75 mg daily or aspirin  81 mg daily.  [name], MD [facility]

## 2023-11-20 NOTE — Telephone Encounter (Signed)
 Pt's medication was sent to pt's pharmacy as requested. Confirmation received.

## 2023-11-20 NOTE — Telephone Encounter (Signed)
 Pt made aware and verbalized understanding. Per chart review, generic ticagrelor  60 mg BID sent to pharmacy.

## 2023-11-25 NOTE — Telephone Encounter (Signed)
 Pt's medication was sent to pt's pharmacy for a 90 days supply with refills on 11/20/23. Confirmation received.

## 2023-11-25 NOTE — Telephone Encounter (Signed)
 Pt c/o medication issue:  1. Name of Medication: ticagrelor  (BRILINTA ) 60 MG TABS tablet   2. How are you currently taking this medication (dosage and times per day)? 60 mg   3. Are you having a reaction (difficulty breathing--STAT)? No   4. What is your medication issue? Pt stated that she needs a 90 day supply called in for insurance to cover it.  They will not cover the 60 day supply

## 2023-11-27 ENCOUNTER — Telehealth: Payer: Self-pay | Admitting: Internal Medicine

## 2023-11-27 NOTE — Telephone Encounter (Signed)
 Patient states that pharmacy is not able to get the Brilinta  60 mg. So she now wants to switch to Plavix. Advised that I would have to check with provider and then will give her a call back. She verbalized understanding.

## 2023-11-27 NOTE — Telephone Encounter (Signed)
 Recommend Tamara [name] stop taking ticagrelor  when she has exhausted her current supply and begin taking clopidogrel 300 mg x 1 the day after her last dose of ticagrelor  followed by 75 mg daily thereafter.  [name], MD [facility]

## 2023-11-27 NOTE — Telephone Encounter (Signed)
 Pt c/o medication issue:  1. Name of Medication: plavix   2. How are you currently taking this medication (dosage and times per day)?    3. Are you having a reaction (difficulty breathing--STAT)? No   4. What is your medication issue?  Patient is calling for a prescription for palvix but its not listed under her medication. Please advise

## 2023-11-28 ENCOUNTER — Other Ambulatory Visit (HOSPITAL_COMMUNITY): Payer: Self-pay

## 2023-11-28 MED ORDER — CLOPIDOGREL BISULFATE 75 MG PO TABS
75.0000 mg | ORAL_TABLET | Freq: Every day | ORAL | 3 refills | Status: AC
Start: 2023-12-26 — End: ?
  Filled 2023-11-28: qty 90, 90d supply, fill #0

## 2023-11-28 MED ORDER — CLOPIDOGREL BISULFATE 75 MG PO TABS
ORAL_TABLET | ORAL | 0 refills | Status: AC
  Filled 2023-11-28: qty 34, 31d supply, fill #0

## 2023-11-28 NOTE — Telephone Encounter (Signed)
 Spoke with patient and reviewed provider recommendations. She has been out of Brilinta  so she will take the 4 tablets (300 mg) when she picks up prescription and then go to once daily after that. She verbalized understanding of all instructions, read back, and had no further questions.

## 2023-12-01 ENCOUNTER — Encounter (HOSPITAL_COMMUNITY): Payer: Self-pay

## 2023-12-01 ENCOUNTER — Other Ambulatory Visit (HOSPITAL_COMMUNITY): Payer: Self-pay

## 2023-12-16 ENCOUNTER — Encounter: Payer: Self-pay | Admitting: Internal Medicine
